# Patient Record
Sex: Male | Born: 1950 | Race: White | Hispanic: No | Marital: Married | Smoking: Never smoker
Health system: Southern US, Community
[De-identification: ages and names within clinical notes are randomized; demographics above are authoritative.]

## PROBLEM LIST (undated history)

## (undated) DIAGNOSIS — K219 Gastro-esophageal reflux disease without esophagitis: Secondary | ICD-10-CM

## (undated) DIAGNOSIS — N400 Enlarged prostate without lower urinary tract symptoms: Secondary | ICD-10-CM

## (undated) DIAGNOSIS — H40009 Preglaucoma, unspecified, unspecified eye: Secondary | ICD-10-CM

## (undated) DIAGNOSIS — N200 Calculus of kidney: Secondary | ICD-10-CM

## (undated) DIAGNOSIS — N281 Cyst of kidney, acquired: Secondary | ICD-10-CM

## (undated) DIAGNOSIS — Z860100 Personal history of colon polyps, unspecified: Secondary | ICD-10-CM

## (undated) DIAGNOSIS — Z9889 Other specified postprocedural states: Secondary | ICD-10-CM

## (undated) DIAGNOSIS — Z87442 Personal history of urinary calculi: Secondary | ICD-10-CM

## (undated) DIAGNOSIS — Z978 Presence of other specified devices: Secondary | ICD-10-CM

## (undated) DIAGNOSIS — Z87448 Personal history of other diseases of urinary system: Secondary | ICD-10-CM

## (undated) DIAGNOSIS — R339 Retention of urine, unspecified: Secondary | ICD-10-CM

## (undated) DIAGNOSIS — Z960 Presence of urogenital implants: Secondary | ICD-10-CM

## (undated) DIAGNOSIS — Z973 Presence of spectacles and contact lenses: Secondary | ICD-10-CM

## (undated) DIAGNOSIS — C801 Malignant (primary) neoplasm, unspecified: Secondary | ICD-10-CM

## (undated) DIAGNOSIS — Z85828 Personal history of other malignant neoplasm of skin: Secondary | ICD-10-CM

## (undated) DIAGNOSIS — G43909 Migraine, unspecified, not intractable, without status migrainosus: Secondary | ICD-10-CM

## (undated) HISTORY — PX: TONSILLECTOMY: SUR1361

---

## 1999-07-15 HISTORY — PX: COLONOSCOPY W/ POLYPECTOMY: SHX1380

## 2000-05-14 ENCOUNTER — Encounter (INDEPENDENT_AMBULATORY_CARE_PROVIDER_SITE_OTHER): Payer: Self-pay | Admitting: Specialist

## 2000-05-14 ENCOUNTER — Encounter: Payer: Self-pay | Admitting: Gastroenterology

## 2000-05-14 ENCOUNTER — Ambulatory Visit (HOSPITAL_COMMUNITY): Admission: RE | Admit: 2000-05-14 | Discharge: 2000-05-14 | Payer: Self-pay | Admitting: Gastroenterology

## 2000-07-14 HISTORY — PX: EXTRACORPOREAL SHOCK WAVE LITHOTRIPSY: SHX1557

## 2000-11-03 ENCOUNTER — Encounter: Admission: RE | Admit: 2000-11-03 | Discharge: 2001-02-01 | Payer: Self-pay | Admitting: Internal Medicine

## 2002-11-17 ENCOUNTER — Ambulatory Visit (HOSPITAL_COMMUNITY): Admission: RE | Admit: 2002-11-17 | Discharge: 2002-11-17 | Payer: Self-pay | Admitting: Gastroenterology

## 2002-11-17 ENCOUNTER — Encounter (INDEPENDENT_AMBULATORY_CARE_PROVIDER_SITE_OTHER): Payer: Self-pay | Admitting: Specialist

## 2004-01-10 ENCOUNTER — Observation Stay (HOSPITAL_COMMUNITY): Admission: EM | Admit: 2004-01-10 | Discharge: 2004-01-10 | Payer: Self-pay | Admitting: Emergency Medicine

## 2006-02-09 ENCOUNTER — Encounter: Admission: RE | Admit: 2006-02-09 | Discharge: 2006-02-09 | Payer: Self-pay | Admitting: Gastroenterology

## 2008-03-18 ENCOUNTER — Emergency Department (HOSPITAL_COMMUNITY): Admission: EM | Admit: 2008-03-18 | Discharge: 2008-03-19 | Payer: Self-pay | Admitting: Emergency Medicine

## 2009-08-14 HISTORY — PX: PARS PLANA VITRECTOMY W/ REPAIR OF MACULAR HOLE: SHX2170

## 2010-07-14 HISTORY — PX: CATARACT EXTRACTION W/ INTRAOCULAR LENS IMPLANT: SHX1309

## 2010-11-29 NOTE — Op Note (Signed)
NAME:  Andrew Coffey, Andrew Coffey                         ACCOUNT NO.:  [id number]   MEDICAL RECORD NO.:  [id number]                   PATIENT TYPE:  AMB   LOCATION:  ENDO                                 FACILITY:  [facility]   PHYSICIAN:  Petra Kuba, M.D.                 DATE OF BIRTH:  [birth year]   DATE OF PROCEDURE:  11/17/2002  DATE OF DISCHARGE:                                 OPERATIVE REPORT   PROCEDURE:  Colonoscopy.   INDICATIONS FOR PROCEDURE:  A patient with a history of colon polyps due for  repeat screening.   Consent was signed after risks, benefits, methods, and options were  thoroughly discussed in the office.   MEDICINES USED:  Demerol 80, Versed 8.5   DESCRIPTION OF PROCEDURE:  Rectal inspection was pertinent for external  hemorrhoids, small. Digital exam was negative. The video colonoscope was  inserted, easily advanced around the colon to the cecum. This did require  some abdominal pressure but no position changes. On insertion, some left  greater than right diverticula were seen. The cecum was identified by the  appendiceal orifice and the ileocecal valve. The scope was slowly withdrawn.  The prep was adequate. There was some liquid stool that required washing and  suctioning. On slow withdrawal through the colon, the cecum, ascending and  transverse were normal except for an occasional diverticula. The diverticula  increased as we went distally. There were a few tiny left sided polyps. The  descending, sigmoid and rectum all appeared hyperplastic and all were cold  biopsied x1 or 2 and put in the same container. Once back in the rectum, the  scope was retroflexed revealing some internal hemorrhoids.  The scope was  straightened and readvanced a short ways up the left side of the colon, air  was suctioned, scope removed. The patient tolerated the procedure well.  There was no obvious or immediate complications.   ENDOSCOPIC DIAGNOSIS:  1. Internal and external  hemorrhoids.  2. Left greater than right diverticula.  3. A few tiny left sided polyps cold biopsied appeared hyperplastic.  4. Otherwise within normal limits to the cecum.    PLAN:  Await pathology to determine future colon screening, would probably  recheck in five years. Return care to Dr. Kevan Ny for the customary health  care maintenance to include yearly rectals and guaiacs and continue workup  with repeat EGD which helped him in the past.                                               Petra Kuba, M.D.    MEM/MEDQ  D:  11/17/2002  T:  11/17/2002  Job:  [id number]   cc:   Candyce Churn, M.D.  [address]  [place] [address]  Fax: [phone]

## 2010-11-29 NOTE — Op Note (Signed)
NAME:  Andrew Coffey, Andrew Coffey                         ACCOUNT NO.:  [id number]   MEDICAL RECORD NO.:  [id number]                   PATIENT TYPE:  AMB   LOCATION:  ENDO                                 FACILITY:  [facility]   PHYSICIAN:  Petra Kuba, M.D.                 DATE OF BIRTH:  10-15-1950   DATE OF PROCEDURE:  11/17/2002  DATE OF DISCHARGE:                                 OPERATIVE REPORT   PROCEDURE:  Esophagogastroduodenoscopy with Savary dilatation.   INDICATIONS FOR PROCEDURE:  Recurring dysphagia.   Consent was signed prior to any premeds given after risks, benefits,  methods, and options were thoroughly discussed multiple times in the past.   ADDITIONAL MEDICINES FOR THIS PROCEDURE:  1.5 mg of Versed only.   DESCRIPTION OF PROCEDURE:  The video endoscope was inserted by direct  vision. The esophagus appeared normal. No obvious hiatal hernia, stricture  or ring were seen. He did seem to have a tapered tortuous distal esophagus.  The scope passed easily into the stomach and advanced through a normal  antrum, normal pylorus into a normal duodenal bulb and around the C loop to  a normal second portion of the duodenum. The scope was slowly withdrawn back  to the bulb. A good look there ruled out abnormalities in that location. The  scope was withdrawn back to the stomach and retroflexed, angularis, cardia  and fundus were all normal. The scope was straightened and straight  visualization of the stomach did not reveal any additional findings. The  scope was then slowly withdrawn back to about 18 cm. Again, a good look at  the esophagus confirmed the above findings. The scope was then slowly  advanced one more time to the antrum. No additional findings were seen. The  Savary wire was advanced and using the 1:1 advancement and withdrawal the  scope was removed. The wire was at the 4 mark which equaled 80 cm although  no fluoro was used. This is the customary place where we are nicely  curled  in the antrum. We went ahead and did Savary without the fluoro using both 14  and then 16 mm dilators. Both times we were concerned we were at the 4 bar  mark after exchange. Both passed with very minimal resistance in the  posterior pharynx and there was no heme on either dilator. We withdrew the  wire back into the 16 when it was almost at his mouth. Both the wire and the  dilator were removed in tandem. The patient tolerated the procedure well,  there was no obvious or immediate complication.   ENDOSCOPIC DIAGNOSIS:  1. Tapered esophagus without obvious ring or stricture.  2. Otherwise normal EGD.   THERAPY:  Savary dilatation without fluoro to 16 mm.    PLAN:  Followup p.r.n. or in two months. Consider a barium swallow if not  done in the past to rule  out achalasia. Certainly if it is questionable for  achalasia would proceed with manometry, but happy to follow him clinically  for now since his dilation in the past seems to have lasted 2-3 years. Since  no obvious reflux change and he has done well without medicines will hold  medicines at this juncture, will discuss that in followup.                                               Petra Kuba, M.D.    MEM/MEDQ  D:  11/17/2002  T:  11/18/2002  Job:  [id number]   cc:   Candyce Churn, M.D.  [address] [place]  [place] [address]  Fax: [phone]

## 2010-11-29 NOTE — Procedures (Signed)
[facility]  Patient:    MOUSA, PROUT                      MRN: [id number] Proc. Date: 05/14/00 Adm. Date:  [id number] Attending:  Nelda Marseille CC:         Pearla Dubonnet, M.D.   Procedure Report  PROCEDURE:  Colonoscopy.  INDICATION:  Patient due for colonic screening.  Consent was signed after risks, benefits, methods, and options thoroughly discussed in the past and by Dr. Haig Prophet P.A., Raynelle Fanning _____.  DESCRIPTION OF PROCEDURE:  Rectal inspection was pertinent for external hemorrhoids.  Digital exam was negative.  Video colonoscope was inserted and a small rectal polyp was seen.  At that juncture, we decided to proceed with a colonoscopy if easily done, which was easily advanced around the colon to the cecum.  Some left greater than right diverticula were seen on insertion, and there were no significant abnormalities.  To advance to the cecum did require some left lower quadrant pressure but no position changes.  The cecum was identified by the appendiceal orifice and the ileocecal valve.  In the cecum, a 1 cm polyp was seen, sessile, which was carefully snared x 1 with very minimal pieces.  Electrocautery was carefully applied at a setting of 20/20, and the pieces removed were suctioned through the scope and collected in the trap.  There seemed to be a touch of residual adenomatous tissue, which was hot biopsied x 1.  The scope was further withdrawn.  In the mid-ascenidng, a 2 mm polyp was seen and was hot biopsied x 1 and put in the same container as the cecal polyp.  In the transverse and descending were four semi-sessile polyps on folds, possibly hyperplastic-appearing, that were all hot biopsied and put in the second container.  Again some occasional right-sided diverticula were seen on slow withdrawal, but they were slightly greater on the left.  No other polypoid lesions were seen as we slowly withdrew back to the rectum.  The  prep was adequate.  There was some liquid stool that required washing and suctioning.  In the rectum, two small polyps were seen and were each hot biopsied x 1 or 2 and put in a third container.  The scope was retroflexed, revealing some internal hemorrhoids.  The scope was straightened and readvanced a short way around the sigmoid, air was suctioned, and scope removed.  The patient tolerated the procedure well, and there was no obvious immediate complication.  ENDOSCOPIC DIAGNOSES: 1. Internal and external hemorrhoids. 2. Left greater than right occasional diverticula. 3. Multiple tiny to small polyps, two in the rectum, four in the descending-    transverse, one in the ascending, and one in the cecum, status post all hot    biopsied except for two snares and a hot biopsy in the cecum. 4. Otherwise within normal limits to the cecum.  PLAN:  Await pathology to determine future colonic screening.  Customary two-weeks postpolypectomy instruction.  See back per endoscopy dictation or call sooner p.r.n.  Otherwise will return care to Dr. Kevan Ny for customary yearly rectals and guaiacs and other health care maintenance. DD:  05/14/00 TD:  05/15/00 Job: [id number]

## 2010-11-29 NOTE — Procedures (Signed)
[facility]  Patient:    Andrew Coffey, Andrew Coffey                      MRN: [id number] Proc. Date: 05/14/00 Adm. Date:  [id number] Attending:  Nelda Marseille CC:         Pearla Dubonnet, M.D.   Procedure Report  REFERRING PHYSICIAN:  Pearla Dubonnet, M.D.  PROCEDURE:  Esophagogastroduodenoscopy with Savary dilatation under fluoroscopy.  INDICATION:  Recurrent dysphagia.  Consent was signed after risks, benefits, methods, and options thoroughly discussed in the office by Raynelle Fanning _____, Dr. Haig Prophet P.A., as well as by me in the past.  MEDICATIONS:  He was given Demerol 60 mg, Versed 6 mg.  DESCRIPTION OF PROCEDURE:  The video endoscope was inserted by direct vision. The proximal and midsesophagus were normal.  In the distal esophagus there seemed to be some tapering like before and a small fibrous ring, maybe a tiny hiatal hernia was seen.  The scope was inserted easily past this area and advanced through a normal antrum, normal pylorus, which widely patent, to a normal duodenal bulb, around the C-loop to a normal second portion of the duodenum.  The scope was slowly withdrawn back to the stomach, which again confirmed the normal duodenum.  Once back in the stomach, the scope was retroflexed.  Angularis, cardia, fundus, lesser and greater curve were all normal on retroflexion and on straight visualization except for some possible minimal gastritis.  The scope was then slowly withdrawn back to 20 cm.  No additional esophageal findings were seen.  The scope was re-advanced to the antrum.  A Savary wire was advanced and confirmed in the proper position under fluoroscopy.  The scope was slowly withdrawn, making sure to keep the wire in the proper location, and once done in succession, the Savary 14, 16, and 17 mm dilators were all advanced into the stomach, confirmed in the proper position. There was no resistance in passing any of the dilators.  Once  the 17 was advanced into the stomach, the wire was withdrawn back in the dilator, the dilator was removed.  There was minimal heme on that dilator but none on the earlier two.  The patient tolerated the procedure well.  There was no obvious immediate complication.  ENDOSCOPIC DIAGNOSES: 1. Tiny hiatal hernia questioning. 2. Slight tapering of the distal esophagus with a minimal fibrous ring. 3. Minimal gastritis. 4. Widely patent pylorus. 5. Otherwise normal EGD.  THERAPY:  Savary dilatation under fluoroscopy to 17 mm.  PLAN:  If he has any upper tract symptoms or this seems to recur quicker than every three years, might consider pump inhibition or continuing workup with a barium swallow and possibly order manometry, but for now will just follow him clinically, proceed with a screening flexible sigmoidoscopy/colonoscopy, and see him back in three to four months to re-discuss his swallowing.  Have him call me sooner p.r.n. DD:  05/14/00 TD:  05/15/00 Job: [id number]

## 2011-04-16 LAB — POCT I-STAT, CHEM 8
Calcium, Ion: 1.15
Chloride: 102
Creatinine, Ser: 1.4
Glucose, Bld: 127 — ABNORMAL HIGH
HCT: 39

## 2011-04-16 LAB — DIFFERENTIAL
Basophils Relative: 0
Eosinophils Absolute: 0
Eosinophils Relative: 0
Monocytes Relative: 9
Neutrophils Relative %: 86 — ABNORMAL HIGH

## 2011-04-16 LAB — POCT CARDIAC MARKERS
CKMB, poc: 2.6
Troponin i, poc: <0.05

## 2011-04-16 LAB — CBC
HCT: 37.8 — ABNORMAL LOW
MCHC: 33.4
MCV: 90.3
Platelets: 221
RBC: 4.19 — ABNORMAL LOW

## 2011-10-16 DIAGNOSIS — IMO0002 Reserved for concepts with insufficient information to code with codable children: Secondary | ICD-10-CM | POA: Insufficient documentation

## 2011-10-16 DIAGNOSIS — H43819 Vitreous degeneration, unspecified eye: Secondary | ICD-10-CM | POA: Insufficient documentation

## 2011-10-16 DIAGNOSIS — H35349 Macular cyst, hole, or pseudohole, unspecified eye: Secondary | ICD-10-CM | POA: Insufficient documentation

## 2011-10-16 DIAGNOSIS — Z961 Presence of intraocular lens: Secondary | ICD-10-CM | POA: Insufficient documentation

## 2011-10-21 ENCOUNTER — Emergency Department (HOSPITAL_COMMUNITY): Admission: EM | Admit: 2011-10-21 | Discharge: 2011-10-21 | Discharge status: Against Medical Advice | Payer: Self-pay

## 2011-10-21 DIAGNOSIS — K222 Esophageal obstruction: Secondary | ICD-10-CM | POA: Insufficient documentation

## 2011-10-21 DIAGNOSIS — IMO0002 Reserved for concepts with insufficient information to code with codable children: Secondary | ICD-10-CM | POA: Insufficient documentation

## 2011-10-21 DIAGNOSIS — T18108A Unspecified foreign body in esophagus causing other injury, initial encounter: Secondary | ICD-10-CM | POA: Insufficient documentation

## 2011-10-21 NOTE — ED Notes (Signed)
Pt is stable no acute distress. Pt states he has a stricter.

## 2011-10-21 NOTE — ED Notes (Signed)
Pt states he left and his throat has started to become irritated from all the coughing

## 2011-10-22 ENCOUNTER — Emergency Department (HOSPITAL_COMMUNITY): Payer: BC Managed Care – PPO

## 2011-10-22 ENCOUNTER — Emergency Department (HOSPITAL_COMMUNITY)
Admission: EM | Admit: 2011-10-22 | Discharge: 2011-10-22 | Disposition: A | Discharge status: Home Health | Payer: BC Managed Care – PPO | Attending: Emergency Medicine | Admitting: Emergency Medicine

## 2011-10-22 ENCOUNTER — Encounter (HOSPITAL_COMMUNITY)
Admission: EM | Disposition: A | Discharge status: Home Health | Payer: Self-pay | Source: Home / Self Care | Attending: Emergency Medicine

## 2011-10-22 ENCOUNTER — Encounter (HOSPITAL_COMMUNITY): Payer: Self-pay | Admitting: Emergency Medicine

## 2011-10-22 DIAGNOSIS — T18108A Unspecified foreign body in esophagus causing other injury, initial encounter: Secondary | ICD-10-CM

## 2011-10-22 HISTORY — PX: ESOPHAGOGASTRODUODENOSCOPY: SHX5428

## 2011-10-22 SURGERY — EGD (ESOPHAGOGASTRODUODENOSCOPY)
Anesthesia: Moderate Sedation

## 2011-10-22 MED ORDER — FENTANYL CITRATE 0.05 MG/ML IJ SOLN
INTRAMUSCULAR | Status: DC | PRN
  Administered 2011-10-22 (×3): 25 ug via INTRAVENOUS

## 2011-10-22 MED ORDER — ONDANSETRON HCL 4 MG/2ML IJ SOLN
4.0000 mg | Freq: Once | INTRAMUSCULAR | Status: AC
  Administered 2011-10-22: 4 mg via INTRAVENOUS
  Filled 2011-10-22: qty 2

## 2011-10-22 MED ORDER — GLUCAGON HCL (RDNA) 1 MG IJ SOLR
1.0000 mg | Freq: Once | INTRAMUSCULAR | Status: AC
  Administered 2011-10-22: 1 mg via INTRAVENOUS
  Filled 2011-10-22: qty 1

## 2011-10-22 MED ORDER — MORPHINE SULFATE 4 MG/ML IJ SOLN
4.0000 mg | Freq: Once | INTRAMUSCULAR | Status: AC
  Administered 2011-10-22: 4 mg via INTRAVENOUS
  Filled 2011-10-22: qty 1

## 2011-10-22 MED ORDER — OMEPRAZOLE 20 MG PO CPDR: 20.0000 mg | DELAYED_RELEASE_CAPSULE | Freq: Every day | ORAL | Status: DC

## 2011-10-22 MED ORDER — MIDAZOLAM HCL 10 MG/2ML IJ SOLN
INTRAMUSCULAR | Status: DC | PRN
  Administered 2011-10-22 (×3): 2 mg via INTRAVENOUS
  Administered 2011-10-22: 1 mg via INTRAVENOUS

## 2011-10-22 MED ORDER — BUTAMBEN-TETRACAINE-BENZOCAINE 2-2-14 % EX AERO
INHALATION_SPRAY | CUTANEOUS | Status: DC | PRN
  Administered 2011-10-22: 1 via TOPICAL

## 2011-10-22 NOTE — Discharge Instructions (Addendum)
Swallowed Foreign Body, Adult You have swallowed an object (foreign body). Once the foreign body has passed through the food tube (esophagus), which leads from the mouth to the stomach, it will usually continue through the body without problems. This is because the point where the esophagus enters into the stomach is the narrowest place through which the foreign body must pass. Sometimes the foreign body gets stuck. The most common type of foreign body obstruction in adults is food impaction. Many times, bones from fish or meat products may become lodged in the esophagus or injure the throat on the way down. When there is an object that obstructs the esophagus, the most obvious symptoms are pain and the inability to swallow normally. In some cases, foreign bodies that can be life threatening are swallowed. Examples of these are certain medications and illicit drugs. Often in these instances, patients are afraid of telling what they swallowed. However, it is extremely important to tell the emergency caregiver what was swallowed because life-saving treatment may be needed.  X-ray exams may be taken to find the location of the foreign body. However, some objects do not show up well or may be too small to be seen on an X-ray image. If the foreign body is too large or too sharp, it may be too dangerous to allow it to pass on its own. You may need to see a caregiver who specializes in the digestive system (gastroenterologist). In a few cases, a specialist may need to remove the object using a method called "endoscopy". This involves passing a thin, soft, flexible tube into the food pipe to locate and remove the object. Follow up with your primary doctor or the referral you were given by the emergency caregiver. HOME CARE INSTRUCTIONS   If your caregiver says it is safe for you to eat, then only have liquids and soft foods until your symptoms improve.   Pureed or finely chopped foods only.  Cut food into small  pieces.   Remove small bones from food.   Remove large seeds and pits from fruit.   Chew your food well.   Do not talk, laugh, or engage in physical activity while eating or swallowing.  SEEK MEDICAL CARE IF:  You develop worsening shortness of breath, uncontrollable coughing, chest pains or high fever, greater than 102 F (38.9 C).   You are unable to eat or drink or you feel that food is getting stuck in your throat.   You have choking symptoms or cannot stop drooling.   You develop abdominal pain, vomiting (especially of blood), or rectal bleeding.  MAKE SURE YOU:   Understand these instructions.   Will watch your condition.   Will get help right away if you are not doing well or get worse.   Our office [facility], [phone]) will call you in the next couple days to arrange repeat endoscopy for esophageal dilatation. Document Released: 12/18/2009 Document Revised: 06/19/2011 Document Reviewed: 12/18/2009 [facility] Patient Information 2012 [place], [place].

## 2011-10-22 NOTE — Consult Note (Signed)
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 Ewing Schlein.  Has had progressive dysphagia of late and presents with suspected food impaction since dinner last night.  Has persistent sialorrhea and sensation of impacted food bolus in distal esophagus.  Has had a prior food impaction years ago with similar symptoms.  Last esophageal dilatation was several years ago per patient.  Some throat and chest soreness; neck xray in ED negative.  PMHx:  Precancerous skin lesion removal             Tonsillectomy             Migraine H/A  MEDS:  Midrin prn  ALL:  NKDA  FHx:  Noncontributory  SocHx:  Attorney  ROS:  As per HPI; all others negative.  PE: GEN:  NAD HEENT:  Sialorrhea with inability to handle secretions CV:  II/VI SEM RUSB; regular Lungs:  CTA ABD:  Soft EXT:  No CCE NEURO:  Diffusely weak, otherwise nonfocal without lateralizing signs.  XRAY:  Neck Xray negative.  Labs:  None  Assessment:  Sialorrhea with suspected esophageal food impaction.  Plan:   1.  Endoscopy with possible esophageal foreign body removal. 2.  Risks (bleeding, infection, bowel perforation that could require surgery, sedation-related changes in cardiopulmonary systems), benefits (identification and possible treatment of source of symptoms, exclusion of certain causes of symptoms), and alternatives (watchful waiting, radiographic imaging studies, empiric medical treatment) of upper endoscopy (EGD) were explained to patient/family in detail and patient wishes to proceed.

## 2011-10-22 NOTE — ED Notes (Signed)
Pt states he has a piece of [place] stuck in his throat  Pt states it is more difficult to breathe and cannot swallow his saliva  Pt states sxs started around 1930 tonight

## 2011-10-22 NOTE — H&P (Signed)
Patient interval history reviewed.  Patient examined again.  There has been no change from documented ED H/P and my consult note dated 10/22/11.  Endoscopy planned for suspected esophageal food impaction.  Risks (bleeding, infection, bowel perforation that could require surgery, sedation-related changes in cardiopulmonary systems), benefits (identification and possible treatment of source of symptoms, exclusion of certain causes of symptoms), and alternatives (watchful waiting, radiographic imaging studies, empiric medical treatment) of upper endoscopy (EGD) were explained to patient/family in detail and patient wishes to proceed.

## 2011-10-22 NOTE — ED Notes (Signed)
Pt given water, unable to swallow - vomited after drinking

## 2011-10-22 NOTE — Op Note (Signed)
[facility] [address] [place], [place]  [address]  ENDOSCOPY PROCEDURE REPORT  PATIENT:  Andrew Coffey, Andrew Coffey  MR#:  [id number] BIRTHDATE:  1950-08-19, 60 yrs. old  GENDER:  male ENDOSCOPIST:  Willis Modena, MD Referred by:  R. Robley Fries, M.D. Vida Rigger, M.D. PROCEDURE DATE:  10/22/2011 PROCEDURE:  EGD with foreign body removal ASA CLASS:  Class II INDICATIONS:  food impaction MEDICATIONS:  Cetacaine spray x 1, Fentanyl 75 mcg IV, Versed 7 mg IV DESCRIPTION OF PROCEDURE:   After the risks benefits and alternatives of the procedure were thoroughly explained, informed consent was obtained.  The Pentax Gastroscope [id number] endoscope was introduced through the mouth and advanced to the second portion of the duodenum, without limitations.  The instrument was slowly withdrawn as the mucosa was fully examined. <<PROCEDUREIMAGES>> FINDINGS:  Large impacted food bolus in distal esophagus. Attempts to break up the bolus with forceps unsuccessful.  Could not get Lucina Mellow net to fully envelop around the bolus.  A pronged basket was able to grasp the bolus, and I was able to pull much of the impacted bolus out through the mouth.  There were some smaller esophageal food remnants seen, which were easily nudged into the stomach.  Upon esophageal clearance, there was tight benign-appearing distal esophageal stricture, approximately 14mm in diameter.  There was some surrounding ulceration and edema in the distal esophagus, likely from prolonged food impaction, but no obvious mucosal tearing and no features of eosinophilic esophagitis were identified.  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  ENDOSCOPIC IMPRESSION:    1.  Large distal esophageal food impaction, removed as described above. 2.  Underlying moderately tight distal esophageal stricture with surrounding edema and  ulceration. 3.  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 4.  Otherwise normal endoscopy.  RECOMMENDATIONS:      1.  Watch for potential complications of procedure. 2.  Avoid fibrous meats, breads, and fresh vegetables. 3.  Consume only pureed or finely chopped/diced foods until further notice. 4.  Prilosec 20 mg once-a-day until further notice. 5.  Will need repeat endoscopy with esophageal dilatation in the next couple weeks by Dr. Ewing Schlein.  REPEAT EXAM:    2 weeks with esophageal dilatation.  ______________________________ Willis Modena  n. eSIGNEDWillis Modena at 10/22/2011 05:23 AM  Leron Croak, [phone]

## 2011-10-22 NOTE — ED Provider Notes (Signed)
History     CSN: [id number]  Arrival date & time 10/21/11  2305   First MD Initiated Contact with Patient 10/22/11 0110      Chief Complaint  Patient presents with  . Foreign Body    (Consider location/radiation/quality/duration/timing/severity/associated sxs/prior treatment) HPI Comments: 61-year-old male with history of esophageal strictures and requires esophageal dilation approximately every 5 years by Dr. Ewing Schlein.  he presents approximately 6 hours after swallowing a large piece of [place] which she feels has become stuck in his throat, place to the manubrium as the location. The symptoms are persistent, gradually getting worse, associated with some mild pain in his neck, headache and some nausea. He has been unable to tolerate his secretions. He states that this has happened in the past but has not required endoscopy to help push the food down in the last few years.  Patient is a 61 y.o. male presenting with foreign body. The history is provided by the patient.  Foreign Body     Past Medical History  Diagnosis Date  . Esophageal stricture   . Headache     Past Surgical History  Procedure Date  . Colonscopy   . Appendectomy   . Esophageal dilitation     History reviewed. No pertinent family history.  History  Substance Use Topics  . Smoking status: Never Smoker   . Smokeless tobacco: Not on file  . Alcohol Use: Yes      Review of Systems  All other systems reviewed and are negative.    Allergies  Review of patient's allergies indicates no known allergies.  Home Medications   Current Outpatient Rx  Name Route Sig Dispense Refill  . OMEPRAZOLE 20 MG PO CPDR Oral Take 1 capsule (20 mg total) by mouth daily.      BP 128/79  Pulse 80  Temp(Src) 97.8 F (36.6 C) (Oral)  Resp 13  Ht 5\' 11"  (1.803 m)  Wt 185 lb (83.915 kg)  BMI 25.80 kg/m2  SpO2 95%  Physical Exam  Nursing note and vitals reviewed. Constitutional: He appears well-developed and  well-nourished. No distress.  HENT:  Head: Normocephalic and atraumatic.  Mouth/Throat: Oropharynx is clear and moist. No oropharyngeal exudate.       Oropharynx is clear, no blood, no foreign bodies, mucous membranes moist  Eyes: Conjunctivae and EOM are normal. Pupils are equal, round, and reactive to light. Right eye exhibits no discharge. Left eye exhibits no discharge. No scleral icterus.  Neck: Normal range of motion. Neck supple. No JVD present. No thyromegaly present.  Cardiovascular: Normal rate, regular rhythm, normal heart sounds and intact distal pulses.  Exam reveals no gallop and no friction rub.   No murmur heard. Pulmonary/Chest: Effort normal and breath sounds normal. No respiratory distress. He has no wheezes. He has no rales.  Abdominal: Soft. Bowel sounds are normal. He exhibits no distension and no mass. There is no tenderness.  Musculoskeletal: Normal range of motion. He exhibits no edema and no tenderness.  Lymphadenopathy:    He has no cervical adenopathy.  Neurological: He is alert. Coordination normal.  Skin: Skin is warm and dry. No rash noted. No erythema.  Psychiatric: He has a normal mood and affect. His behavior is normal.    ED Course  Procedures (including critical care time)  Labs Reviewed - No data to display Dg Neck Soft Tissue  10/22/2011  *RADIOLOGY REPORT*  Clinical Data: Piece of [place] stuck in throat.  NECK SOFT TISSUES - 1+ VIEW  Comparison: None.  Findings: No radiopaque foreign bodies are identified.  The nasopharynx, oropharynx and hypopharynx are grossly unremarkable in appearance.  The epiglottis is within normal limits.  The aryepiglottic folds are unremarkable.  The proximal trachea is unremarkable in appearance.  Prevertebral soft tissues are normal in thickness.  Mild degenerative change is noted along the lower cervical spine, with scattered small anterior and posterior disc osteophyte complexes. The visualized paranasal sinuses and mastoid  air cells are well- aerated.  The visualized lung apices are clear.  IMPRESSION: No radiopaque foreign bodies seen; no significant abnormality seen with respect to the soft tissues of the neck.  Original Report Authenticated By: Tonia Ghent, M.D.     1. Esophageal foreign body       MDM  Patient appears mildly uncomfortable and is unable to tolerate his own salivary secretions. He is not vomiting and has vital signs which are within normal limits on my exam. We'll perform lateral soft tissue of the neck x-ray, glucagon, contact gastroenterology for likely endoscopy and dilation.   X-ray of the neck negative for radiopaque foreign body, patient has not responded well to glucagon, I have consult with gastroenterologist Dr. Dulce Sellar who is aware of the patient and will prepare the endoscopy team to take care of this esophageal obstruction within the next work time.    Vida Roller, MD 10/22/11 [phone]

## 2011-10-24 ENCOUNTER — Encounter (HOSPITAL_COMMUNITY): Payer: Self-pay | Admitting: Gastroenterology

## 2011-11-03 ENCOUNTER — Other Ambulatory Visit: Payer: Self-pay | Admitting: Dermatology

## 2011-11-20 ENCOUNTER — Encounter (HOSPITAL_COMMUNITY): Payer: Self-pay | Admitting: Pharmacy Technician

## 2011-11-21 ENCOUNTER — Ambulatory Visit (HOSPITAL_COMMUNITY)
Admission: RE | Admit: 2011-11-21 | Discharge: 2011-11-21 | Disposition: A | Discharge status: Home / Self Care | Payer: BC Managed Care – PPO | Source: Ambulatory Visit | Attending: Gastroenterology | Admitting: Gastroenterology

## 2011-11-21 ENCOUNTER — Encounter (HOSPITAL_COMMUNITY)
Admission: RE | Disposition: A | Discharge status: Home / Self Care | Payer: Self-pay | Source: Ambulatory Visit | Attending: Gastroenterology

## 2011-11-21 ENCOUNTER — Encounter (HOSPITAL_COMMUNITY): Payer: Self-pay | Admitting: *Deleted

## 2011-11-21 ENCOUNTER — Ambulatory Visit (HOSPITAL_COMMUNITY): Payer: BC Managed Care – PPO

## 2011-11-21 DIAGNOSIS — K449 Diaphragmatic hernia without obstruction or gangrene: Secondary | ICD-10-CM | POA: Insufficient documentation

## 2011-11-21 HISTORY — PX: ESOPHAGOGASTRODUODENOSCOPY: SHX5428

## 2011-11-21 HISTORY — PX: SAVORY DILATION: SHX5439

## 2011-11-21 SURGERY — EGD (ESOPHAGOGASTRODUODENOSCOPY)
Anesthesia: Moderate Sedation

## 2011-11-21 MED ORDER — FENTANYL CITRATE 0.05 MG/ML IJ SOLN
INTRAMUSCULAR | Status: AC
  Filled 2011-11-21: qty 2

## 2011-11-21 MED ORDER — SODIUM CHLORIDE 0.9 % IV SOLN
Freq: Once | INTRAVENOUS | Status: AC
  Administered 2011-11-21: 13:00:00 via INTRAVENOUS

## 2011-11-21 MED ORDER — FENTANYL NICU IV SYRINGE 50 MCG/ML
INJECTION | INTRAMUSCULAR | Status: DC | PRN
  Administered 2011-11-21 (×3): 25 ug via INTRAVENOUS

## 2011-11-21 MED ORDER — DIPHENHYDRAMINE HCL 50 MG/ML IJ SOLN
INTRAMUSCULAR | Status: AC
  Filled 2011-11-21: qty 1

## 2011-11-21 MED ORDER — BUTAMBEN-TETRACAINE-BENZOCAINE 2-2-14 % EX AERO
INHALATION_SPRAY | CUTANEOUS | Status: DC | PRN
  Administered 2011-11-21: 2 via TOPICAL

## 2011-11-21 MED ORDER — MIDAZOLAM HCL 10 MG/2ML IJ SOLN
INTRAMUSCULAR | Status: DC | PRN
  Administered 2011-11-21 (×3): 2 mg via INTRAVENOUS

## 2011-11-21 MED ORDER — MIDAZOLAM HCL 10 MG/2ML IJ SOLN
INTRAMUSCULAR | Status: AC
  Filled 2011-11-21: qty 2

## 2011-11-21 NOTE — Discharge Instructions (Signed)
Call if question or problem or swallowing problems return and consider calling for a stronger prescription stomach medicine otherwise followup in one to 2 months and began with soft solids and slowly advance diet as tolerated

## 2011-11-21 NOTE — Op Note (Signed)
[facility] [address] [place], [place]  [address]  ENDOSCOPY PROCEDURE REPORT  PATIENT:  Coffey, Andrew  MR#:  [id number] BIRTHDATE:  [birth year], 60 yrs. old  GENDER:  male  ENDOSCOPIST:  Vida Rigger, MD Referred by:  R. Robley Fries, M.D.  PROCEDURE DATE:  11/21/2011 PROCEDURE:  EGD with dilatation over guidewire ASA CLASS:  Class I INDICATIONS:  Dysphagia history of food impaction  MEDICATIONS:  75 mcg of fentanyl and 6 mg of Versed TOPICAL ANESTHETIC: Used  DESCRIPTION OF PROCEDURE:   After the risks benefits and alternatives of the procedure were thoroughly explained, informed consent was obtained.  The Pentax Gastroscope [id number] endoscope was introduced through the mouth and advanced to the second portion of the duodenum, without limitations.  The instrument was slowly withdrawn as the mucosa was fully examined. Other than a tiny or small hiatal hernia and a tortuous esophagus no abnormalities were seen. Once the endoscopy was complete the scope was readvanced to the antrum a Savary wire was advanced and the customary J. loop of the wire was confirmed both endoscopically and under fluoroscopy and using fluoroscopy guidance the scope was removed making sure to keep the wire in the proper position in the antrum and once the scope was removed a Savary 16 mm dilator was advanced and confirmed in the proper position in the stomach under fluoroscopy there was no resistance in advancing this dilator the wire was withdrawn back into the dilator and both were removed in tandem there was no blood on the dilator and the procedure was terminated at this junction and the patient tolerated the procedure well and there was no obvious immediate complication <<PROCEDUREIMAGES>>  FINDINGS: 1 Tiny or small hiatal hernia and tortuous esophagus 2. Otherwise within normal limits EGD status post Savary dilatation under fluoroscopy to 16 mm only without resistance  or heme  COMPLICATIONS: None  ENDOSCOPIC IMPRESSION:  Above  RECOMMENDATIONS: Slowly advance diet continue pump inhibitor call me when necessary otherwise followup in one to 2 months to recheck symptoms and make sure no further workup or plans are needed  REPEAT EXAM:  As needed  ______________________________ Vida Rigger, MD  CC:  R. Robley Fries, MD  n. Rosalie DoctorVernia Buff Finis Hendricksen at 11/21/2011 01:21 PM  Leron Croak, [phone]

## 2011-11-24 ENCOUNTER — Encounter (HOSPITAL_COMMUNITY): Payer: Self-pay | Admitting: Gastroenterology

## 2012-07-14 HISTORY — PX: MOHS SURGERY: SUR867

## 2014-02-16 ENCOUNTER — Other Ambulatory Visit: Payer: Self-pay | Admitting: Dermatology

## 2014-10-04 ENCOUNTER — Inpatient Hospital Stay (HOSPITAL_COMMUNITY)
Admission: EM | Admit: 2014-10-04 | Discharge: 2014-10-08 | DRG: 684 | Disposition: A | Discharge status: Home / Self Care | Payer: BLUE CROSS/BLUE SHIELD | Attending: Family Medicine | Admitting: Family Medicine

## 2014-10-04 ENCOUNTER — Encounter (HOSPITAL_COMMUNITY): Payer: Self-pay | Admitting: Emergency Medicine

## 2014-10-04 DIAGNOSIS — N323 Diverticulum of bladder: Secondary | ICD-10-CM | POA: Diagnosis present

## 2014-10-04 DIAGNOSIS — K222 Esophageal obstruction: Secondary | ICD-10-CM | POA: Diagnosis present

## 2014-10-04 DIAGNOSIS — E875 Hyperkalemia: Secondary | ICD-10-CM | POA: Diagnosis present

## 2014-10-04 DIAGNOSIS — R51 Headache: Secondary | ICD-10-CM

## 2014-10-04 DIAGNOSIS — N179 Acute kidney failure, unspecified: Secondary | ICD-10-CM | POA: Diagnosis present

## 2014-10-04 DIAGNOSIS — N132 Hydronephrosis with renal and ureteral calculous obstruction: Secondary | ICD-10-CM | POA: Diagnosis present

## 2014-10-04 DIAGNOSIS — G8929 Other chronic pain: Secondary | ICD-10-CM | POA: Diagnosis present

## 2014-10-04 DIAGNOSIS — K219 Gastro-esophageal reflux disease without esophagitis: Secondary | ICD-10-CM | POA: Diagnosis present

## 2014-10-04 DIAGNOSIS — G4700 Insomnia, unspecified: Secondary | ICD-10-CM | POA: Diagnosis present

## 2014-10-04 DIAGNOSIS — N401 Enlarged prostate with lower urinary tract symptoms: Secondary | ICD-10-CM | POA: Diagnosis present

## 2014-10-04 DIAGNOSIS — R519 Headache, unspecified: Secondary | ICD-10-CM | POA: Diagnosis present

## 2014-10-04 DIAGNOSIS — IMO0001 Reserved for inherently not codable concepts without codable children: Secondary | ICD-10-CM | POA: Diagnosis present

## 2014-10-04 DIAGNOSIS — R030 Elevated blood-pressure reading, without diagnosis of hypertension: Secondary | ICD-10-CM

## 2014-10-04 DIAGNOSIS — Z85828 Personal history of other malignant neoplasm of skin: Secondary | ICD-10-CM

## 2014-10-04 DIAGNOSIS — C4491 Basal cell carcinoma of skin, unspecified: Secondary | ICD-10-CM | POA: Diagnosis present

## 2014-10-04 DIAGNOSIS — K210 Gastro-esophageal reflux disease with esophagitis: Secondary | ICD-10-CM | POA: Diagnosis not present

## 2014-10-04 DIAGNOSIS — D649 Anemia, unspecified: Secondary | ICD-10-CM | POA: Diagnosis present

## 2014-10-04 HISTORY — DX: Migraine, unspecified, not intractable, without status migrainosus: G43.909

## 2014-10-04 HISTORY — DX: Gastro-esophageal reflux disease without esophagitis: K21.9

## 2014-10-04 LAB — CBC WITH DIFFERENTIAL/PLATELET
BASOS PCT: 0 % (ref 0–1)
Basophils Absolute: 0 10*3/uL (ref 0.0–0.1)
Eosinophils Absolute: 0.2 10*3/uL (ref 0.0–0.7)
Eosinophils Relative: 3 % (ref 0–5)
HCT: 28.5 % — ABNORMAL LOW (ref 39.0–52.0)
HEMOGLOBIN: 9.5 g/dL — AB (ref 13.0–17.0)
LYMPHS ABS: 1 10*3/uL (ref 0.7–4.0)
Lymphocytes Relative: 18 % (ref 12–46)
MCH: 29.1 pg (ref 26.0–34.0)
MCHC: 33.3 g/dL (ref 30.0–36.0)
MCV: 87.4 fL (ref 78.0–100.0)
Monocytes Absolute: 0.7 10*3/uL (ref 0.1–1.0)
Monocytes Relative: 13 % — ABNORMAL HIGH (ref 3–12)
Neutro Abs: 3.6 10*3/uL (ref 1.7–7.7)
Neutrophils Relative %: 66 % (ref 43–77)
Platelets: 158 10*3/uL (ref 150–400)
RBC: 3.26 MIL/uL — AB (ref 4.22–5.81)
RDW: 13.8 % (ref 11.5–15.5)
WBC: 5.5 10*3/uL (ref 4.0–10.5)

## 2014-10-04 LAB — COMPREHENSIVE METABOLIC PANEL
ALK PHOS: 36 U/L — AB (ref 39–117)
ALT: 22 U/L (ref 0–53)
ANION GAP: 14 (ref 5–15)
AST: 27 U/L (ref 0–37)
Albumin: 3.9 g/dL (ref 3.5–5.2)
BUN: 106 mg/dL — AB (ref 6–23)
CHLORIDE: 103 mmol/L (ref 96–112)
CO2: 22 mmol/L (ref 19–32)
CREATININE: 7.13 mg/dL — AB (ref 0.50–1.35)
Calcium: 9.8 mg/dL (ref 8.4–10.5)
GFR, EST AFRICAN AMERICAN: 8 mL/min — AB (ref >90)
GFR, EST NON AFRICAN AMERICAN: 7 mL/min — AB (ref >90)
Glucose, Bld: 111 mg/dL — ABNORMAL HIGH (ref 70–99)
Potassium: 4.8 mmol/L (ref 3.5–5.1)
Sodium: 139 mmol/L (ref 135–145)
Total Bilirubin: 0.5 mg/dL (ref 0.3–1.2)
Total Protein: 7.7 g/dL (ref 6.0–8.3)

## 2014-10-04 LAB — URINE MICROSCOPIC-ADD ON

## 2014-10-04 LAB — URINALYSIS, ROUTINE W REFLEX MICROSCOPIC
Bilirubin Urine: NEGATIVE
Glucose, UA: NEGATIVE mg/dL
Ketones, ur: NEGATIVE mg/dL
NITRITE: NEGATIVE
Protein, ur: NEGATIVE mg/dL
Specific Gravity, Urine: 1.008 (ref 1.005–1.030)
UROBILINOGEN UA: 0.2 mg/dL (ref 0.0–1.0)
pH: 5.5 (ref 5.0–8.0)

## 2014-10-04 LAB — PROTEIN / CREATININE RATIO, URINE
Creatinine, Urine: 54.76 mg/dL
Total Protein, Urine: <6 mg/dL

## 2014-10-04 LAB — LACTATE DEHYDROGENASE: LDH: 162 U/L (ref 94–250)

## 2014-10-04 LAB — CREATININE, URINE, RANDOM: Creatinine, Urine: 54.37 mg/dL

## 2014-10-04 LAB — PROTIME-INR
INR: 1.17 (ref 0.00–1.49)
Prothrombin Time: 15 seconds (ref 11.6–15.2)

## 2014-10-04 LAB — SODIUM, URINE, RANDOM: Sodium, Ur: 43 mmol/L

## 2014-10-04 MED ORDER — POLYVINYL ALCOHOL 1.4 % OP SOLN: 1.0000 [drp] | Freq: Two times a day (BID) | OPHTHALMIC | Status: DC | PRN

## 2014-10-04 MED ORDER — HYDRALAZINE HCL 20 MG/ML IJ SOLN: 5.0000 mg | INTRAMUSCULAR | Status: DC | PRN

## 2014-10-04 MED ORDER — POLYETHYL GLYCOL-PROPYL GLYCOL 0.4-0.3 % OP SOLN: 1.0000 [drp] | Freq: Two times a day (BID) | OPHTHALMIC | Status: DC | PRN

## 2014-10-04 MED ORDER — ACETAMINOPHEN 650 MG RE SUPP: 650.0000 mg | Freq: Four times a day (QID) | RECTAL | Status: DC | PRN

## 2014-10-04 MED ORDER — ACETAMINOPHEN 325 MG PO TABS
650.0000 mg | ORAL_TABLET | Freq: Four times a day (QID) | ORAL | Status: DC | PRN
  Administered 2014-10-05 – 2014-10-06 (×2): 650 mg via ORAL
  Filled 2014-10-04 (×2): qty 2

## 2014-10-04 MED ORDER — HEPARIN SODIUM (PORCINE) 5000 UNIT/ML IJ SOLN
5000.0000 [IU] | Freq: Three times a day (TID) | INTRAMUSCULAR | Status: DC
  Administered 2014-10-04 – 2014-10-05 (×3): 5000 [IU] via SUBCUTANEOUS
  Filled 2014-10-04 (×5): qty 1

## 2014-10-04 MED ORDER — SODIUM CHLORIDE 0.9 % IV SOLN
INTRAVENOUS | Status: DC
  Administered 2014-10-04 – 2014-10-05 (×2): via INTRAVENOUS

## 2014-10-04 NOTE — Consult Note (Signed)
Reason for Consult: Acute renal failure Referring Physician: Dwaine Deter M.D. (patient's primary care provider) and Charlesetta Shanks M.D. (ED physician)   HPI:  63 year old Caucasian man with past medical history significant for esophageal stricture status post EGD/esophageal dilation, history of migraine type headaches and an apparently normal renal function at baseline. He was seen earlier today by Dr. Inda Merlin when he presented with an irritable stomach, worsening fatigue and insomnia together with dysgeusia "a copper taste in his mouth". Dr. Inda Merlin did labs that showed an elevated BUN of 108, creatinine 7.3 and potassium 5.3 as well as mild anemia-hemoglobin 9.9 g/dL.  About one month ago, he felt like he was having a cold and took some Advil Cold and Sinus that led to problems with poor urine output/urinary hesitancy (with persistent cough/congestion/clear sputum production) for which he saw Dr. Inda Merlin and was started on azithromycin-the latter of which relieved his cough/congestion/sputum production but he continued to have nocturia about 5-8 times every night. His nocturia caused insomnia. He states that his oral intake has been fair-he has made adjustments to compensate for his dysgeusia. He denies any pedal edema, orthopnea, orthostatic symptoms or gross hematuria. He has had previous episodes of dried crusted blood when he blows his nose but no history of epistaxis or hemoptysis.  He has never suffered acute renal failure in the past, he has never had kidney stones documented. He has been evaluated in the past by Dr. Risa Grill for microscopic hematuria that he has had recognized 4 years ago and more recently 1-1/2 years ago. Urology workup was negative for a source of his hematuria. He denies any recurrent vomiting or diarrhea and recently developed some nausea with gastritis type symptoms and poor appetite. He has not had any intravenous contrast exposure and has not been taking any nonsteroidals  (after stopping Advil Cold and Sinus about a month ago). He denies any skin rash and denies history of blood transfusions.  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  Past Medical History  Diagnosis Date  . Esophageal stricture   . Headache(784.0)   . Dysphagia   . Macular hole of right eye     repaired 2011    Past Surgical History  Procedure Laterality Date  . Colonscopy    . Appendectomy    . Esophageal dilitation    . Esophagogastroduodenoscopy  10/22/2011    Procedure: ESOPHAGOGASTRODUODENOSCOPY (EGD);  Surgeon: Arta Silence, MD;  Location: [facility];  Service: Endoscopy;  Laterality: N/A;  . Pars plana vitrectomy w/ repair of macular hole    . Eye surgery    . Cataract extraction      RT eye  . Esophagogastroduodenoscopy  11/21/2011    Procedure: ESOPHAGOGASTRODUODENOSCOPY (EGD);  Surgeon: Jeryl Columbia, MD;  Location[facility] ENDOSCOPY;  Service: Endoscopy;  Laterality: N/A;  have balloons available. will need c-arm  . Savory dilation  11/21/2011    Procedure: SAVORY DILATION;  Surgeon: Jeryl Columbia, MD;  Location: [facility] ENDOSCOPY;  Service: Endoscopy;  Laterality: N/A;    History reviewed. No pertinent family history.  Social History:  reports that he has never smoked. He does not have any smokeless tobacco history on file. He reports that he drinks alcohol. He reports that he does not use illicit drugs.  Allergies: No Known Allergies  Medications: Prior to Admission:  (Not in a hospital admission)  Results for orders placed or performed during the hospital encounter  of 10/04/14 (from the past 48 hour(s))  CBC with Differential     Status: Abnormal   Collection Time: 10/04/14  7:52 PM  Result Value Ref Range   WBC 5.5 4.0 - 10.5 K/uL   RBC 3.26 (L) 4.22 - 5.81 MIL/uL   Hemoglobin 9.5 (L) 13.0 - 17.0 g/dL   HCT 28.5 (L) 39.0 - 52.0 %   MCV 87.4 78.0 - 100.0 fL   MCH 29.1 26.0 - 34.0 pg   MCHC 33.3  30.0 - 36.0 g/dL   RDW 13.8 11.5 - 15.5 %   Platelets 158 150 - 400 K/uL   Neutrophils Relative % 66 43 - 77 %   Neutro Abs 3.6 1.7 - 7.7 K/uL   Lymphocytes Relative 18 12 - 46 %   Lymphs Abs 1.0 0.7 - 4.0 K/uL   Monocytes Relative 13 (H) 3 - 12 %   Monocytes Absolute 0.7 0.1 - 1.0 K/uL   Eosinophils Relative 3 0 - 5 %   Eosinophils Absolute 0.2 0.0 - 0.7 K/uL   Basophils Relative 0 0 - 1 %   Basophils Absolute 0.0 0.0 - 0.1 K/uL    No results found.  Review of Systems  Constitutional: Positive for chills and malaise/fatigue. Negative for fever, weight loss and diaphoresis.  HENT: Negative.   Eyes: Negative.   Respiratory: Positive for cough.   Cardiovascular: Negative.   Gastrointestinal: Positive for nausea and abdominal pain. Negative for vomiting, diarrhea and constipation.       Dysgeusia  Genitourinary: Positive for frequency. Negative for dysuria and flank pain.       Nocturia  Musculoskeletal: Negative.   Skin: Negative.   Neurological: Positive for weakness. Negative for dizziness, tingling and tremors.  Endo/Heme/Allergies: Negative.   All other systems reviewed and are negative.  Blood pressure 161/89, pulse 60, temperature 98.1 F (36.7 C), temperature source Oral, height 5' 10.5" (1.791 m), weight 81.647 kg (180 lb), SpO2 100 %. Physical Exam  Nursing note and vitals reviewed. Constitutional: He is oriented to person, place, and time. He appears well-developed and well-nourished. No distress.  HENT:  Head: Normocephalic and atraumatic.  Mouth/Throat: Oropharynx is clear and moist.  Eyes: Conjunctivae and EOM are normal. Pupils are equal, round, and reactive to light. No scleral icterus.  Neck: Normal range of motion. Neck supple. No JVD present. No thyromegaly present.  Cardiovascular: Normal rate, regular rhythm and normal heart sounds.  Exam reveals no friction rub.   No murmur heard. Respiratory: Effort normal and breath sounds normal. No respiratory  distress. He has no wheezes.  GI: Soft. Bowel sounds are normal. He exhibits no distension. There is no tenderness. There is no guarding.  Bladder margin felt suprapubic  Musculoskeletal: Normal range of motion. He exhibits no edema.  Neurological: He is alert and oriented to person, place, and time. No cranial nerve deficit. Coordination normal.  Skin: Skin is warm and dry. No rash noted. No erythema.  Psychiatric: He has a normal mood and affect. His behavior is normal.    Assessment/Plan: 1. Acute renal failure: His history of hematuria and the rather rapid worsening of the renal function point towards possible RPGN although I cannot entirely rule out the possibilities of acute tubular necrosis (hemodynamically mediated by recent URI) or acute interstitial nephritis from recent medication exposures. Serologies have been requested including antinuclear antibody, ANCA and complement levels. I will rule out plasma cell dyscrasia with an SPEP/free light chains and request a renal ultrasound to rule  out possible obstructive uropathy given the palpable bladder. Urine electrolytes will be checked including urine sodium/creatinine and a urine protein/creatinine ratio be quantified. A trial of intravenous fluids will be attempted overnight with normal saline at 150 mL per hour. He does not have any acute dialysis needs at this time (we discussed the possible need for dialysis). May need a renal biopsy for definitive diagnosis and prognosis based on lab data from today. 2. Anemia: Hemoglobin apparently dropped from 12-9.9 over the past month or so-we'll check iron studies and recheck urinalysis for possible hematuria. No indications at this time for chest x-ray but may be important if [place] suspected (he does not have symptoms at this time). 3. Hyperkalemia: Mild, monitor with intravenous fluid therapy 4. Hypertension: May indeed be situational as he has always had good blood pressure control when checked as an  outpatient-this may be partly from anxiety but cannot rule out the impact of his acute renal failure and the possibility of a nephritic syndrome without more data.  Sonia Bromell K. 10/04/2014, 8:26 PM

## 2014-10-04 NOTE — ED Provider Notes (Signed)
CSN: [id number]     Arrival date & time 10/04/14  1924 History   First MD Initiated Contact with Patient 10/04/14 2002     Chief Complaint  Patient presents with  . Abnormal Lab     (Consider location/radiation/quality/duration/timing/severity/associated sxs/prior Treatment) HPI Comments: Andrew Coffey is a 64 y.o. male with a PMHx of esophageal stricture, chronic headaches, and R eye macular hole s/p repair, who presents to the ED after being sent here by Dr. Josetta Huddle his PCP at [facility], who found him to have abnormal labs. His H/H were 9.9/30.4 respectively, his Cr was 7.3, and BUN 108. The note he brings with him from his PCP states that his symptoms included increased thirst, enuresis, fatigue, with a coppery taste in his mouth. Patient reports to me that the symptoms have been ongoing for 3 weeks which prompted him to see his regular doctor today. Additionally reports that when he is climbing stairs or exerting himself, he feels somewhat short of breath and fatigue. Denies any fevers, chills, chest pain, shortness of breath at this time, abdominal pain, nausea, vomiting, diarrhea, hematochezia, melena, hematuria, dysuria, numbness, tingling, or weakness. States his urinary frequency is only at night. Denies any pain at this time.   The history is provided by the patient. No language interpreter was used.    Past Medical History  Diagnosis Date  . Esophageal stricture   . Headache(784.0)   . Dysphagia   . Macular hole of right eye     repaired 2011   Past Surgical History  Procedure Laterality Date  . Colonscopy    . Appendectomy    . Esophageal dilitation    . Esophagogastroduodenoscopy  10/22/2011    Procedure: ESOPHAGOGASTRODUODENOSCOPY (EGD);  Surgeon: Arta Silence, MD;  Location: [facility] ENDOSCOPY;  Service: Endoscopy;  Laterality: N/A;  . Pars plana vitrectomy w/ repair of macular hole    . Eye surgery    . Cataract extraction      RT eye  . Esophagogastroduodenoscopy   11/21/2011    Procedure: ESOPHAGOGASTRODUODENOSCOPY (EGD);  Surgeon: Jeryl Columbia, MD;  Location: [facility] ENDOSCOPY;  Service: Endoscopy;  Laterality: N/A;  have balloons available. will need c-arm  . Savory dilation  11/21/2011    Procedure: SAVORY DILATION;  Surgeon: Jeryl Columbia, MD;  Location: [facility] ENDOSCOPY;  Service: Endoscopy;  Laterality: N/A;   History reviewed. No pertinent family history. History  Substance Use Topics  . Smoking status: Never Smoker   . Smokeless tobacco: Not on file  . Alcohol Use: Yes     Comment: occasional    Review of Systems  Constitutional: Positive for fatigue. Negative for fever and chills.  Respiratory: Positive for shortness of breath (with exertion only, feels fatigued). Negative for wheezing.   Cardiovascular: Negative for chest pain.  Gastrointestinal: Negative for nausea, vomiting, abdominal pain, diarrhea and blood in stool.  Endocrine: Positive for polydipsia.  Genitourinary: Positive for frequency (only at night) and enuresis. Negative for dysuria and hematuria.  Musculoskeletal: Negative for myalgias and arthralgias.  Skin: Negative for color change.  Allergic/Immunologic: Negative for immunocompromised state.  Neurological: Negative for weakness and numbness.   10 Systems reviewed and are negative for acute change except as noted in the HPI.   Allergies  Review of patient's allergies indicates no known allergies.  Home Medications   Prior to Admission medications   Medication Sig Start Date End Date Taking? Authorizing Provider  isometheptene-acetaminophen-dichloralphenazone (MIDRIN) 65-325-100 MG capsule Take 1 capsule by mouth 4 (  four) times daily as needed. For Migraines   Yes Historical Provider, MD  Polyethyl Glycol-Propyl Glycol (SYSTANE) 0.4-0.3 % SOLN Place 1 drop into both eyes 2 (two) times daily as needed. For dry eyes   Yes Historical Provider, MD  omeprazole (PRILOSEC) 20 MG capsule Take 1 capsule (20 mg total) by mouth daily.  10/22/11 10/21/12  Arta Silence, MD   BP 161/89 mmHg  Pulse 60  Temp(Src) 98.1 F (36.7 C) (Oral)  Ht 5' 10.5" (1.791 m)  Wt 180 lb (81.647 kg)  BMI 25.45 kg/m2  SpO2 100% Physical Exam  Constitutional: He is oriented to person, place, and time. Vital signs are normal. He appears well-developed and well-nourished.  Non-toxic appearance. No distress.  Afebrile, nontoxic, NAD  HENT:  Head: Normocephalic and atraumatic.  Mouth/Throat: Oropharynx is clear and moist and mucous membranes are normal.  Eyes: Conjunctivae and EOM are normal. Right eye exhibits no discharge. Left eye exhibits no discharge.  Neck: Normal range of motion. Neck supple.  Cardiovascular: Normal rate, regular rhythm, normal heart sounds and intact distal pulses.  Exam reveals no gallop and no friction rub.   No murmur heard. Pulmonary/Chest: Effort normal and breath sounds normal. No respiratory distress. He has no decreased breath sounds. He has no wheezes. He has no rhonchi. He has no rales.  Abdominal: Soft. Normal appearance and bowel sounds are normal. He exhibits no distension. There is no tenderness. There is no rigidity, no rebound, no guarding, no CVA tenderness, no tenderness at McBurney's point and negative Murphy's sign.  Musculoskeletal: Normal range of motion.  MAE x4  Neurological: He is alert and oriented to person, place, and time. He has normal strength. No sensory deficit.  Skin: Skin is warm, dry and intact. No rash noted.  Psychiatric: He has a normal mood and affect.  Nursing note and vitals reviewed.   ED Course  Procedures (including critical care time) Labs Review Labs Reviewed  CBC WITH DIFFERENTIAL/PLATELET  COMPREHENSIVE METABOLIC PANEL  URINALYSIS, ROUTINE W REFLEX MICROSCOPIC  SODIUM, URINE, RANDOM  CREATININE, URINE, RANDOM  MPO/PR-3 (ANCA) ANTIBODIES  GLOMERULAR BASEMENT MEMBRANE ANTIBODIES  C3 COMPLEMENT  C4 COMPLEMENT  FERRITIN  IRON AND TIBC  PROTEIN ELECTROPHORESIS,  SERUM  KAPPA/LAMBDA LIGHT CHAINS  RENAL FUNCTION PANEL    Imaging Review No results found.   EKG Interpretation None      MDM   Final diagnoses:  ARF (acute renal failure)  Esophageal stricture    64 y.o. male here after being sent by PCP Dr. Josetta Huddle for abnormal labs, kidney function elevated. He had been having urinary frequency at night and fatigue as well as a copper taste in his mouth, per note. When I went to evaluate pt, Dr. Posey Pronto of [place] [facility] already in to see the pt and requested that I place the consult for admission. He has already placed orders for the pt. After evaluating pt, he is stable with no abdominal tenderness, clear lung exam. Will consult for admission.  8:30 PM Dr. Blaine Hamper saw pt and placed admission orders. Please see his dictation for further documentation of care.   BP 159/87 mmHg  Pulse 62  Temp(Src) 98.1 F (36.7 C) (Oral)  Resp 15  Ht 5' 10.5" (1.791 m)  Wt 180 lb (81.647 kg)  BMI 25.45 kg/m2  SpO2 100%   Stephen Baruch Camprubi-Soms, PA-C 10/04/14 2112  Charlesetta Shanks, MD 10/04/14 2142

## 2014-10-04 NOTE — ED Notes (Signed)
Patient here after doctors office visit today. Lab work at PCP showed concern for acute renal failure. Creatine and BUN: 7.3 and 108 from PCP paperwork. Patient went to PCP for concerns of urinary frequency, indigestion, and fatigue.

## 2014-10-04 NOTE — H&P (Signed)
[facility] History and Physical  Andrew Coffey WPY:[id number] DOB: 27-May-1951 DOA: 10/04/2014  Referring physician: ED physician PCP: [name], MD  Specialists:   Chief Complaint: Abnormal lab  HPI: Andrew Coffey is a 64 y.o. male with PMHx of esophageal stricture, chronic headaches, and R eye macular hole s/p repair, who presents to the ED after being sent here by Dr. Josetta Huddle his PCP at [facility], who found patient to have abnormal labs.   Patient reports that he has been having some nonspecific symptoms, including polyuria in the night, increased thirst coppery taste in his mouth in the past several weeks. He visited his PCP today, who found the patient has new anemia with Hgb of 9.9 and acute renal injury with Cr 7.3 and BUN 108. Patient was sent here for further evaluation and treatment. Patient reports that he had a normal colonoscopy approximately 3 years ago. He does not have hematuria or hematochezia, no dizziness, shortness of breath or chest pain. No tenderness over flank areas. No recent injury to the kidney areas. He has chronic intermittent headache, but no headache today. Patient denies fever, chills, cough, chest pain, SOB, abdominal pain, diarrhea, constipation, skin rashes, joint pain or leg swelling. No unilateral weakness, numbness or tingling sensations. No vision change or hearing loss.  Of note, patient used Advil Cold on 2/14 for cold likely symptoms, that led to problems with poor urine output/urinary hesitancy. His urinary symptoms resolved after discontinuation of Advil. He was evaluated in the past by Dr. Risa Grill for microscopic hematuria 4 years ago and more recently 1-1/2 years ago. Urology workup was negative for a source of his hematuria.  In ED, patient was found to have hemoglobin drop from 13.3 on 03/18/2008 to 9.5 today. Creatinine 7.13, BUN 106. Patient is admitted to inpatient for further evaluation and treatment. Nephrology was  consulted.  Review of Systems: As presented in the history of presenting illness, rest negative.  Where does patient live?  At home Can patient participate in ADLs? Yes  Allergy: No Known Allergies  Past Medical History  Diagnosis Date  . Esophageal stricture   . Headache(784.0)   . Dysphagia   . Macular hole of right eye     repaired 2011  . GERD (gastroesophageal reflux disease)     Past Surgical History  Procedure Laterality Date  . Colonscopy    . Appendectomy    . Esophageal dilitation    . Esophagogastroduodenoscopy  10/22/2011    Procedure: ESOPHAGOGASTRODUODENOSCOPY (EGD);  Surgeon: Arta Silence, MD;  Location: [facility];  Service: Endoscopy;  Laterality: N/A;  . Pars plana vitrectomy w/ repair of macular hole    . Eye surgery    . Cataract extraction      RT eye  . Esophagogastroduodenoscopy  11/21/2011    Procedure: ESOPHAGOGASTRODUODENOSCOPY (EGD);  Surgeon: Jeryl Columbia, MD;  Location: [facility] ENDOSCOPY;  Service: Endoscopy;  Laterality: N/A;  have balloons available. will need c-arm  . Savory dilation  11/21/2011    Procedure: SAVORY DILATION;  Surgeon: Jeryl Columbia, MD;  Location: [facility] ENDOSCOPY;  Service: Endoscopy;  Laterality: N/A;    Social History:  reports that he has never smoked. He does not have any smokeless tobacco history on file. He reports that he drinks alcohol. He reports that he does not use illicit drugs.  Family History:  Family History  Problem Relation Age of Onset  . Thrombocytopenia Mother     died of ITP  . Heart attack Father   .  Thyroid disease Sister      Prior to Admission medications   Medication Sig Start Date End Date Taking? Authorizing Provider  isometheptene-acetaminophen-dichloralphenazone (MIDRIN) 65-325-100 MG capsule Take 1 capsule by mouth 4 (four) times daily as needed. For Migraines   Yes Historical Provider, MD  Polyethyl Glycol-Propyl Glycol (SYSTANE) 0.4-0.3 % SOLN Place 1 drop into both eyes 2 (two) times daily  as needed. For dry eyes   Yes Historical Provider, MD  omeprazole (PRILOSEC) 20 MG capsule Take 1 capsule (20 mg total) by mouth daily. 10/22/11 10/21/12  Arta Silence, MD    Physical Exam: Filed Vitals:   10/04/14 1946 10/04/14 2026 10/04/14 2115  BP: 161/89 159/87 159/91  Pulse: 60 62 65  Temp: 98.1 F (36.7 C)    TempSrc: Oral    Resp:  15 23  Height: 5' 10.5" (1.791 m)    Weight: 81.647 kg (180 lb)    SpO2: 100% 100% 98%   General: Not in acute distress. Pale looking HEENT:       Eyes: PERRL, EOMI, no scleral icterus       ENT: No discharge from the ears and nose, no pharynx injection, no tonsillar enlargement.        Neck: No JVD, no bruit, no mass felt. Cardiac: S1/S2, RRR, No murmurs, No gallops or rubs Pulm: Good air movement bilaterally. Clear to auscultation bilaterally. No rales, wheezing, rhonchi or rubs. Abd: Soft, nondistended, nontender, no rebound pain, no organomegaly, BS present Ext: No edema bilaterally. 2+DP/PT pulse bilaterally Musculoskeletal: No joint deformities, erythema, or stiffness, ROM full Skin: No rashes.  Neuro: Alert and oriented X3, cranial nerves II-XII grossly intact, muscle strength 5/5 in all extremeties, sensation to light touch intact.  Psych: Patient is not psychotic, no suicidal or hemocidal ideation.  Labs on Admission:  Basic Metabolic Panel:  Recent Labs Lab 10/04/14 1952  NA 139  K 4.8  CL 103  CO2 22  GLUCOSE 111*  BUN 106*  CREATININE 7.13*  CALCIUM 9.8   Liver Function Tests:  Recent Labs Lab 10/04/14 1952  AST 27  ALT 22  ALKPHOS 36*  BILITOT 0.5  PROT 7.7  ALBUMIN 3.9   No results for input(s): LIPASE, AMYLASE in the last 168 hours. No results for input(s): AMMONIA in the last 168 hours. CBC:  Recent Labs Lab 10/04/14 1952  WBC 5.5  NEUTROABS 3.6  HGB 9.5*  HCT 28.5*  MCV 87.4  PLT 158   Cardiac Enzymes: No results for input(s): CKTOTAL, CKMB, CKMBINDEX, TROPONINI in the last 168 hours.  BNP  (last 3 results) No results for input(s): BNP in the last 8760 hours.  ProBNP (last 3 results) No results for input(s): PROBNP in the last 8760 hours.  CBG: No results for input(s): GLUCAP in the last 168 hours.  Radiological Exams on Admission: No results found.  EKG: will get one  Assessment/Plan Principal Problem:   Acute renal failure Active Problems:   GERD (gastroesophageal reflux disease)   Anemia   Chronic headache   Elevated blood pressure  Acute renal failure: Etiology is not clear. Nephrology was consulted. Dr. Posey Pronto saw patient. Differential diagnosis include RPGN, acute tubular necrosis and acute interstitial nephritis from recent medication exposures per Dr. Posey Pronto. - will admit to med-surg bed - Appreciate nephrology's consultation, will follow-up recommendations as follows: - check antinuclear antibody, ANCA, complement levels, SPEP/free light chains - renal US to r/o obstructive uropathy. - urine sodium/creatinine and urine protein/creatinine ratio be quantified. - A  trial of intravenous fluids will be attempted overnight with normal saline at 150 mL per hour. - UA  Anemia: Hemoglobin 9.5, MCV 87.4.  -FOBT -Haptoglobin and LDH to r/o hemolysis though less likely given normal total bilirubin -Iron, TIBC and Ferrin per Dr. Posey Pronto  Elevated blood pressure:  No history of hypertension: Blood pressure is 159/87 on admission. May be partly from anxiety but cannot rule out the impact of his acute renal failure. -IV hydralazine when necessary  GERD: On omeprazole at home. Patient does not have symptoms currently. -Will hold omeprazole since PPI has side effects of causing interstitial nephritis.  Chronic headache: Stable. No headache today. -Hold home Midrin -Tylenol when necessary  DVT ppx: SQ Heparin     Code Status: Full code Family Communication: None at bed side.  Disposition Plan: Admit to inpatient   Date of Service 10/04/2014    Ivor Costa [facility] Pager [phone]  If 7PM-7AM, please contact night-coverage [email or web address] Password [facility] 10/04/2014, 9:30 PM

## 2014-10-05 ENCOUNTER — Inpatient Hospital Stay (HOSPITAL_COMMUNITY): Payer: BLUE CROSS/BLUE SHIELD

## 2014-10-05 LAB — IRON AND TIBC
Iron: 36 ug/dL — ABNORMAL LOW (ref 42–165)
Saturation Ratios: 11 % — ABNORMAL LOW (ref 20–55)
TIBC: 332 ug/dL (ref 215–435)
UIBC: 296 ug/dL (ref 125–400)

## 2014-10-05 LAB — FERRITIN: Ferritin: 143 ng/mL (ref 22–322)

## 2014-10-05 LAB — CBC
HEMATOCRIT: 28.5 % — AB (ref 39.0–52.0)
HEMOGLOBIN: 9.4 g/dL — AB (ref 13.0–17.0)
MCH: 29.4 pg (ref 26.0–34.0)
MCHC: 33 g/dL (ref 30.0–36.0)
MCV: 89.1 fL (ref 78.0–100.0)
PLATELETS: 153 10*3/uL (ref 150–400)
RBC: 3.2 MIL/uL — ABNORMAL LOW (ref 4.22–5.81)
RDW: 14 % (ref 11.5–15.5)
WBC: 6.1 10*3/uL (ref 4.0–10.5)

## 2014-10-05 LAB — RENAL FUNCTION PANEL
ALBUMIN: 3.6 g/dL (ref 3.5–5.2)
ANION GAP: 12 (ref 5–15)
BUN: 104 mg/dL — ABNORMAL HIGH (ref 6–23)
CALCIUM: 9.2 mg/dL (ref 8.4–10.5)
CO2: 20 mmol/L (ref 19–32)
Chloride: 107 mmol/L (ref 96–112)
Creatinine, Ser: 7.31 mg/dL — ABNORMAL HIGH (ref 0.50–1.35)
GFR, EST AFRICAN AMERICAN: 8 mL/min — AB (ref >90)
GFR, EST NON AFRICAN AMERICAN: 7 mL/min — AB (ref >90)
GLUCOSE: 108 mg/dL — AB (ref 70–99)
PHOSPHORUS: 7 mg/dL — AB (ref 2.3–4.6)
POTASSIUM: 5 mmol/L (ref 3.5–5.1)
SODIUM: 139 mmol/L (ref 135–145)

## 2014-10-05 LAB — HEMOGLOBIN AND HEMATOCRIT, BLOOD
HCT: 28.6 % — ABNORMAL LOW (ref 39.0–52.0)
Hemoglobin: 9.6 g/dL — ABNORMAL LOW (ref 13.0–17.0)

## 2014-10-05 MED ORDER — TAMSULOSIN HCL 0.4 MG PO CAPS
0.4000 mg | ORAL_CAPSULE | Freq: Every day | ORAL | Status: DC
  Administered 2014-10-05 – 2014-10-08 (×4): 0.4 mg via ORAL
  Filled 2014-10-05 (×4): qty 1

## 2014-10-05 NOTE — Progress Notes (Signed)
[facility] PROGRESS NOTE  Andrew Coffey ERD:[id number] DOB: 07/11/1951 DOA: 10/04/2014 PCP: Andrew Screws, MD  Assessment/Plan:  Acute kidney injury- patient presented with acute kidney injury with elevated BUN and creatinine. Renal ultrasound shows significant bilateral hydronephrosis, bladder diverticulum as well as prostate enlargement. Foley catheter will be inserted today. We'll also consult urology.  Anemia Patient presented with hemoglobin 9.5 with MCV 87.4, FOBT has been ordered and is still pending. Follow CBC in a.m.  GERD Patient was taking omeprazole at home but PPI ER hold due to side effects of interstitial nephritis.  DVT prophylaxis Heparin  Code Status: Full code Family Communication: No family at bedside Disposition Plan: Home when stable   Consultants:  Nephrology  Procedures:  None  Antibiotics:  None  HPI/Subjective: 64 year old male who was sent to the hospital from the primary care office after he was found to have abnormal labs. Patient found to have acute kidney injury with creatinine 7.31 and BUN 108. Patient says that he took 10 days of ibuprofen cold and sinus tablets last month and after that had difficulty in urination. He was prescribed antibodies per the PCP but never recovered from it has continued to have increased frequency of urination and nocturia. In the hospital renal ultrasound was done which showed bilateral hydronephrosis, bladder diverticulum as well as prostate enlargement. Patient denies any complains this morning  Objective: Filed Vitals:   10/05/14 0549  BP: 158/81  Pulse: 61  Temp: 98.3 F (36.8 C)  Resp: 16    Intake/Output Summary (Last 24 hours) at 10/05/14 1310 Last data filed at 10/05/14 1124  Gross per 24 hour  Intake   1290 ml  Output   4650 ml  Net  -3360 ml   Filed Weights   10/04/14 1946  Weight: 81.647 kg (180 lb)    Exam:   General:  Appears in no acute  distress  Cardiovascular: S1-S2 is regular, no murmurs auscultated   Respiratory: Clear to auscultation bilaterally, no adventitious sounds heard   Abdomen: Soft, nontender, no organomegaly   Musculoskeletal: No edema noted in the lower extremities , moving all extremities  Data Reviewed: Basic Metabolic Panel:  Recent Labs Lab 10/04/14 1952 10/05/14 0710  NA 139 139  K 4.8 5.0  CL 103 107  CO2 22 20  GLUCOSE 111* 108*  BUN 106* 104*  CREATININE 7.13* 7.31*  CALCIUM 9.8 9.2  PHOS  --  7.0*   Liver Function Tests:  Recent Labs Lab 10/04/14 1952 10/05/14 0710  AST 27  --   ALT 22  --   ALKPHOS 36*  --   BILITOT 0.5  --   PROT 7.7  --   ALBUMIN 3.9 3.6   No results for input(s): LIPASE, AMYLASE in the last 168 hours. No results for input(s): AMMONIA in the last 168 hours. CBC:  Recent Labs Lab 10/04/14 1952 10/05/14 0710  WBC 5.5 6.1  NEUTROABS 3.6  --   HGB 9.5* 9.4*  HCT 28.5* 28.5*  MCV 87.4 89.1  PLT 158 153   Cardiac Enzymes: No results for input(s): CKTOTAL, CKMB, CKMBINDEX, TROPONINI in the last 168 hours. BNP (last 3 results) No results for input(s): BNP in the last 8760 hours.  ProBNP (last 3 results) No results for input(s): PROBNP in the last 8760 hours.  CBG: No results for input(s): GLUCAP in the last 168 hours.  No results found for this or any previous visit (from the past 240 hour(s)).   Studies: US Renal  10/05/2014   CLINICAL DATA:  Acute renal failure. Weakness and shortness of breath. History of renal stones.  EXAM: RENAL/URINARY TRACT ULTRASOUND COMPLETE  COMPARISON:  CT 12/13/2010  FINDINGS: Right Kidney:  Length: Enlarged measuring 14.2 cm. There is moderate hydronephrosis. The renal pelvis measures 4.6 cm proximally. No definite shadowing stones.  Left Kidney:  Length: Enlarged measuring 16.3 cm. There is moderate hydronephrosis. The renal pelvis measures 4.2 cm, proximal ureter measures 1.7 cm. Large cyst in the mid upper  kidney measures 10.1 x 11.7 x 10.1 cm. There is a 1.0 x 0.9 cm echogenic structure consistent with stone in the lower kidney.  Bladder:  Distended, bladder volume of 1,111 cc. Question of right-sided diverticulum measuring 1.5 x 1.5 x 1.8 cm. The prostate gland is enlarged measuring 6.2 x 6.7 by 5.9 cm with irregular borders, calcifications, and mass effect on the bladder base. The left distal ureter is seen, dilated in its course measuring 1.4-1.6 cm. The right distal ureter is dilated measuring 1.7 cm.  IMPRESSION: 1. Distended urinary bladder with questionable bladder diverticulum. There is moderate bilateral hydroureteronephrosis. Nonobstructing stone in the left kidney. 2. Enlarged irregular prostate gland with calcifications. Findings suggest bladder outlet obstruction. Correlation with PSA recommended. 3. Large left renal cyst.   Electronically Signed   By: Jeb Levering M.D.   On: 10/05/2014 05:46    Scheduled Meds: . heparin  5,000 Units Subcutaneous 3 times per day   Continuous Infusions:   Principal Problem:   Acute renal failure Active Problems:   GERD (gastroesophageal reflux disease)   Anemia   Chronic headache   Elevated blood pressure    Time spent: *25 min    [facility] Pager [phone]*. If 7PM-7AM, please contact night-coverage at [email or web address], password [facility] 10/05/2014, 1:10 PM  LOS: 1 day

## 2014-10-05 NOTE — Progress Notes (Signed)
Utilization review completed.  

## 2014-10-05 NOTE — Progress Notes (Signed)
[facility] ROUNDING NOTE   Subjective:   Interval History: about to have foley catheter placed    Objective:  Vital signs in last 24 hours:  Temp:  [98.1 F (36.7 C)-99.1 F (37.3 C)] 98.3 F (36.8 C) (03/24 0549) Pulse Rate:  [60-65] 61 (03/24 0549) Resp:  [15-23] 16 (03/24 0549) BP: (158-163)/(81-96) 158/81 mmHg (03/24 0549) SpO2:  [98 %-100 %] 99 % (03/24 0549) Weight:  [81.647 kg (180 lb)] 81.647 kg (180 lb) (03/23 1946)  Weight change:  Filed Weights   10/04/14 1946  Weight: 81.647 kg (180 lb)    Intake/Output: I/O last 3 completed shifts: In: 1290 [P.O.:110; I.V.:1180] Out: 1150 [Urine:1150]   Intake/Output this shift:  Total I/O In: -  Out: 1975 [Urine:1975]  CVS- RRR  Faint ejection systolic murmur RS- CTA ABD- BS present EXT- no edema   Basic Metabolic Panel:  Recent Labs Lab 10/04/14 1952 10/05/14 0710  NA 139 139  K 4.8 5.0  CL 103 107  CO2 22 20  GLUCOSE 111* 108*  BUN 106* 104*  CREATININE 7.13* 7.31*  CALCIUM 9.8 9.2  PHOS  --  7.0*    Liver Function Tests:  Recent Labs Lab 10/04/14 1952 10/05/14 0710  AST 27  --   ALT 22  --   ALKPHOS 36*  --   BILITOT 0.5  --   PROT 7.7  --   ALBUMIN 3.9 3.6   No results for input(s): LIPASE, AMYLASE in the last 168 hours. No results for input(s): AMMONIA in the last 168 hours.  CBC:  Recent Labs Lab 10/04/14 1952 10/05/14 0710  WBC 5.5 6.1  NEUTROABS 3.6  --   HGB 9.5* 9.4*  HCT 28.5* 28.5*  MCV 87.4 89.1  PLT 158 153    Cardiac Enzymes: No results for input(s): CKTOTAL, CKMB, CKMBINDEX, TROPONINI in the last 168 hours.  BNP: Invalid input(s): POCBNP  CBG: No results for input(s): GLUCAP in the last 168 hours.  Microbiology: No results found for this or any previous visit.  Coagulation Studies:  Recent Labs  10/04/14 2238  LABPROT 15.0  INR 1.17    Urinalysis:  Recent Labs  10/04/14 2142  COLORURINE YELLOW  LABSPEC 1.008  PHURINE 5.5   GLUCOSEU NEGATIVE  HGBUR MODERATE*  BILIRUBINUR NEGATIVE  KETONESUR NEGATIVE  PROTEINUR NEGATIVE  UROBILINOGEN 0.2  NITRITE NEGATIVE  LEUKOCYTESUR TRACE*      Imaging: US Renal  10/05/2014   CLINICAL DATA:  Acute renal failure. Weakness and shortness of breath. History of renal stones.  EXAM: RENAL/URINARY TRACT ULTRASOUND COMPLETE  COMPARISON:  CT 12/13/2010  FINDINGS: Right Kidney:  Length: Enlarged measuring 14.2 cm. There is moderate hydronephrosis. The renal pelvis measures 4.6 cm proximally. No definite shadowing stones.  Left Kidney:  Length: Enlarged measuring 16.3 cm. There is moderate hydronephrosis. The renal pelvis measures 4.2 cm, proximal ureter measures 1.7 cm. Large cyst in the mid upper kidney measures 10.1 x 11.7 x 10.1 cm. There is a 1.0 x 0.9 cm echogenic structure consistent with stone in the lower kidney.  Bladder:  Distended, bladder volume of 1,111 cc. Question of right-sided diverticulum measuring 1.5 x 1.5 x 1.8 cm. The prostate gland is enlarged measuring 6.2 x 6.7 by 5.9 cm with irregular borders, calcifications, and mass effect on the bladder base. The left distal ureter is seen, dilated in its course measuring 1.4-1.6 cm. The right distal ureter is dilated measuring 1.7 cm.  IMPRESSION: 1. Distended urinary bladder with questionable  bladder diverticulum. There is moderate bilateral hydroureteronephrosis. Nonobstructing stone in the left kidney. 2. Enlarged irregular prostate gland with calcifications. Findings suggest bladder outlet obstruction. Correlation with PSA recommended. 3. Large left renal cyst.   Electronically Signed   By: Jeb Levering M.D.   On: 10/05/2014 05:46     Medications:     . heparin  5,000 Units Subcutaneous 3 times per day   acetaminophen **OR** acetaminophen, hydrALAZINE, polyvinyl alcohol  Assessment/ Plan:   Acute renal failure with pending serological evaluation . Bilateral hydronephrosis and bladder distention noted. Recommend  urological evaluation  Call Dr Risa Grill ( patients urologist)    Anemia awaiting iron studies  ARF appears to be obstructive uropathy   LOS: 1 Darlin Stenseth W @TODAY @10 :38 AM

## 2014-10-05 NOTE — Consult Note (Signed)
Urology Consult  Referring physician: G Lama Reason for referral: hydrobnephrosis  Chief Complaint: Bilateral hydrohephrosis  History of Present Illness: REnal u/sound noted bilateral hydro and bladder tic and BPH; has foley; admitted for medical issues; Cr 7.31; nephrology assessed; prostate irregular; sees Grapey and normal PSA in clinic; normal work up for hematuria; cold meds lately and weaker stream; metallic taste etc and found to be in renal failure No urge/large volume; blood now with foley but not initial Modifying factors: There are no other modifying factors  Associated signs and symptoms: There are no other associated signs and symptoms Aggravating and relieving factors: There are no other aggravating or relieving factors Severity: Severe Duration: Persistent  Past Medical History  Diagnosis Date  . Esophageal stricture   . Dysphagia   . Macular hole of right eye     repaired 2011  . GERD (gastroesophageal reflux disease)   . Kidney stones ~ 2002  . Migraine     "probably 3 times/yr" (10/04/2014)  . Acute renal failure (ARF) 10/04/2014  . Basal cell carcinoma of face X 2   Past Surgical History  Procedure Laterality Date  . Colonoscopy    . Esophagogastroduodenoscopy (egd) with esophageal dilation  ~ 2010; 11/2011  . Esophagogastroduodenoscopy  10/22/2011    Procedure: ESOPHAGOGASTRODUODENOSCOPY (EGD);  Surgeon: William Outlaw, MD;  Location: [facility];  Service: Endoscopy;  Laterality: N/A;  . Pars plana vitrectomy w/ repair of macular hole Right ~ 08/2009  . Cataract extraction Right ~ 2012  . Esophagogastroduodenoscopy  11/21/2011    Procedure: ESOPHAGOGASTRODUODENOSCOPY (EGD);  Surgeon: Marc E Magod, MD;  Location: [facility];  Service: Endoscopy;  Laterality: N/A;  have balloons available.   . Savory dilation  11/21/2011    Procedure: SAVORY DILATION;  Surgeon: Marc E Magod, MD;  Location: [facility];  Service: Endoscopy;  Laterality: N/A;  . Tonsillectomy  ~  1960  . Lithotripsy  ~ 2002  . Eye surgery    . Mohs surgery  X 1  . Basal cell carcinoma excision  X 1    "face"    Medications: I have reviewed the patient's current medications. Allergies: No Known Allergies  Family History  Problem Relation Age of Onset  . Thrombocytopenia Mother     died of ITP  . Heart attack Father   . Thyroid disease Sister    Social History:  reports that he has never smoked. He has never used smokeless tobacco. He reports that he drinks about 3.6 oz of alcohol per week. He reports that he does not use illicit drugs.  ROS: All systems are reviewed and negative except as noted. Rest negative  Physical Exam:  Vital signs in last 24 hours: Temp:  [98.1 F (36.7 C)-99.1 F (37.3 C)] 98.3 F (36.8 C) (03/24 0549) Pulse Rate:  [60-65] 61 (03/24 0549) Resp:  [15-23] 16 (03/24 0549) BP: (158-163)/(81-96) 158/81 mmHg (03/24 0549) SpO2:  [98 %-100 %] 99 % (03/24 0549) Weight:  [81.647 kg (180 lb)] 81.647 kg (180 lb) (03/23 1946)  Cardiovascular: Skin warm; not flushed Respiratory: Breaths quiet; no shortness of breath Abdomen: No masses Neurological: Normal sensation to touch Musculoskeletal: Normal motor function arms and legs Lymphatics: No inguinal adenopathy Skin: No rashes Genitourinary:genitalia normal; foley  Laboratory Data:  Results for orders placed or performed during the hospital encounter of 10/04/14 (from the past 72 hour(s))  CBC with Differential     Status: Abnormal   Collection Time: 10/04/14  7:52 PM  Result Value   Ref Range   WBC 5.5 4.0 - 10.5 K/uL   RBC 3.26 (L) 4.22 - 5.81 MIL/uL   Hemoglobin 9.5 (L) 13.0 - 17.0 g/dL   HCT 28.5 (L) 39.0 - 52.0 %   MCV 87.4 78.0 - 100.0 fL   MCH 29.1 26.0 - 34.0 pg   MCHC 33.3 30.0 - 36.0 g/dL   RDW 13.8 11.5 - 15.5 %   Platelets 158 150 - 400 K/uL   Neutrophils Relative % 66 43 - 77 %   Neutro Abs 3.6 1.7 - 7.7 K/uL   Lymphocytes Relative 18 12 - 46 %   Lymphs Abs 1.0 0.7 - 4.0 K/uL    Monocytes Relative 13 (H) 3 - 12 %   Monocytes Absolute 0.7 0.1 - 1.0 K/uL   Eosinophils Relative 3 0 - 5 %   Eosinophils Absolute 0.2 0.0 - 0.7 K/uL   Basophils Relative 0 0 - 1 %   Basophils Absolute 0.0 0.0 - 0.1 K/uL  Comprehensive metabolic panel     Status: Abnormal   Collection Time: 10/04/14  7:52 PM  Result Value Ref Range   Sodium 139 135 - 145 mmol/L   Potassium 4.8 3.5 - 5.1 mmol/L   Chloride 103 96 - 112 mmol/L   CO2 22 19 - 32 mmol/L   Glucose, Bld 111 (H) 70 - 99 mg/dL   BUN 106 (H) 6 - 23 mg/dL   Creatinine, Ser 7.13 (H) 0.50 - 1.35 mg/dL   Calcium 9.8 8.4 - 10.5 mg/dL   Total Protein 7.7 6.0 - 8.3 g/dL   Albumin 3.9 3.5 - 5.2 g/dL   AST 27 0 - 37 U/L   ALT 22 0 - 53 U/L   Alkaline Phosphatase 36 (L) 39 - 117 U/L   Total Bilirubin 0.5 0.3 - 1.2 mg/dL   GFR calc non Af Amer 7 (L) >90 mL/min   GFR calc Af Amer 8 (L) >90 mL/min    Comment: (NOTE) The eGFR has been calculated using the CKD EPI equation. This calculation has not been validated in all clinical situations. eGFR's persistently <90 mL/min signify possible Chronic Kidney Disease.    Anion gap 14 5 - 15  Ferritin     Status: None   Collection Time: 10/04/14  8:30 PM  Result Value Ref Range   Ferritin 143 22 - 322 ng/mL    Comment: Performed at [facility]  Iron and TIBC     Status: Abnormal   Collection Time: 10/04/14  8:30 PM  Result Value Ref Range   Iron 36 (L) 42 - 165 ug/dL   TIBC 332 215 - 435 ug/dL   Saturation Ratios 11 (L) 20 - 55 %   UIBC 296 125 - 400 ug/dL    Comment: Performed at [facility]  Urinalysis, Routine w reflex microscopic     Status: Abnormal   Collection Time: 10/04/14  9:42 PM  Result Value Ref Range   Color, Urine YELLOW YELLOW   APPearance CLEAR CLEAR   Specific Gravity, Urine 1.008 1.005 - 1.030   pH 5.5 5.0 - 8.0   Glucose, UA NEGATIVE NEGATIVE mg/dL   Hgb urine dipstick MODERATE (A) NEGATIVE   Bilirubin Urine NEGATIVE NEGATIVE   Ketones, ur  NEGATIVE NEGATIVE mg/dL   Protein, ur NEGATIVE NEGATIVE mg/dL   Urobilinogen, UA 0.2 0.0 - 1.0 mg/dL   Nitrite NEGATIVE NEGATIVE   Leukocytes, UA TRACE (A) NEGATIVE  Sodium, urine, random       Status: None   Collection Time: 10/04/14  9:42 PM  Result Value Ref Range   Sodium, Ur 43 mmol/L  Creatinine, urine, random     Status: None   Collection Time: 10/04/14  9:42 PM  Result Value Ref Range   Creatinine, Urine 54.37 mg/dL  Protein / creatinine ratio, urine     Status: None   Collection Time: 10/04/14  9:42 PM  Result Value Ref Range   Creatinine, Urine 54.76 mg/dL   Total Protein, Urine <6 mg/dL    Comment: REPEATED TO VERIFY NO NORMAL RANGE ESTABLISHED FOR THIS TEST    Protein Creatinine Ratio        0.00 - 0.15    Comment: RESULT BELOW REPORTABLE RANGE, UNABLE TO CALCULATE.   Urine microscopic-add on     Status: None   Collection Time: 10/04/14  9:42 PM  Result Value Ref Range   Squamous Epithelial / LPF RARE RARE   WBC, UA 3-6 <3 WBC/hpf   RBC / HPF 0-2 <3 RBC/hpf   Bacteria, UA RARE RARE  Protime-INR     Status: None   Collection Time: 10/04/14 10:38 PM  Result Value Ref Range   Prothrombin Time 15.0 11.6 - 15.2 seconds   INR 1.17 0.00 - 1.49  Lactate dehydrogenase     Status: None   Collection Time: 10/04/14 10:38 PM  Result Value Ref Range   LDH 162 94 - 250 U/L  Renal function panel     Status: Abnormal   Collection Time: 10/05/14  7:10 AM  Result Value Ref Range   Sodium 139 135 - 145 mmol/L   Potassium 5.0 3.5 - 5.1 mmol/L   Chloride 107 96 - 112 mmol/L   CO2 20 19 - 32 mmol/L   Glucose, Bld 108 (H) 70 - 99 mg/dL   BUN 104 (H) 6 - 23 mg/dL   Creatinine, Ser 7.31 (H) 0.50 - 1.35 mg/dL   Calcium 9.2 8.4 - 10.5 mg/dL   Phosphorus 7.0 (H) 2.3 - 4.6 mg/dL   Albumin 3.6 3.5 - 5.2 g/dL   GFR calc non Af Amer 7 (L) >90 mL/min   GFR calc Af Amer 8 (L) >90 mL/min    Comment: (NOTE) The eGFR has been calculated using the CKD EPI equation. This calculation has  not been validated in all clinical situations. eGFR's persistently <90 mL/min signify possible Chronic Kidney Disease.    Anion gap 12 5 - 15  CBC     Status: Abnormal   Collection Time: 10/05/14  7:10 AM  Result Value Ref Range   WBC 6.1 4.0 - 10.5 K/uL   RBC 3.20 (L) 4.22 - 5.81 MIL/uL   Hemoglobin 9.4 (L) 13.0 - 17.0 g/dL   HCT 28.5 (L) 39.0 - 52.0 %   MCV 89.1 78.0 - 100.0 fL   MCH 29.4 26.0 - 34.0 pg   MCHC 33.0 30.0 - 36.0 g/dL   RDW 14.0 11.5 - 15.5 %   Platelets 153 150 - 400 K/uL   No results found for this or any previous visit (from the past 240 hour(s)). Creatinine:  Recent Labs  10/04/14 1952 10/05/14 0710  CREATININE 7.13* 7.31*    Xrays: See report/chart U/sound: see note  Impression/Assessment:  Bilateral hydro and renal failure  Plan:  Start flomax; leave foley in; irrigate cath PRN; send home with foley and flomax; follow up with Dr Grapey; will follow with Grapey on flomax and trial of voiding  MACDIARMID,SCOTT A 10/05/2014, 2:16   PM      

## 2014-10-05 NOTE — Progress Notes (Signed)
MD paged for urine output of 8156ml since 0945. Has also had dark red urine that started about 1200. Suggested getting H&H ordered since Hgb this AM was 9.4. Urology already aware of hematuria and increased output-spoke with urologist when she saw patient this afternoon. Passed along to oncoming nurse.

## 2014-10-06 LAB — COMPREHENSIVE METABOLIC PANEL
ALT: 19 U/L (ref 0–53)
ANION GAP: 13 (ref 5–15)
AST: 26 U/L (ref 0–37)
Albumin: 4 g/dL (ref 3.5–5.2)
Alkaline Phosphatase: 38 U/L — ABNORMAL LOW (ref 39–117)
BUN: 75 mg/dL — ABNORMAL HIGH (ref 6–23)
CO2: 24 mmol/L (ref 19–32)
Calcium: 9.9 mg/dL (ref 8.4–10.5)
Chloride: 103 mmol/L (ref 96–112)
Creatinine, Ser: 5.02 mg/dL — ABNORMAL HIGH (ref 0.50–1.35)
GFR, EST AFRICAN AMERICAN: 13 mL/min — AB (ref >90)
GFR, EST NON AFRICAN AMERICAN: 11 mL/min — AB (ref >90)
GLUCOSE: 163 mg/dL — AB (ref 70–99)
Potassium: 4.3 mmol/L (ref 3.5–5.1)
Sodium: 140 mmol/L (ref 135–145)
Total Bilirubin: 0.8 mg/dL (ref 0.3–1.2)
Total Protein: 8.4 g/dL — ABNORMAL HIGH (ref 6.0–8.3)

## 2014-10-06 LAB — PROTEIN ELECTROPHORESIS, SERUM
A/G Ratio: 1 (ref 0.7–2.0)
ALBUMIN ELP: 3.9 g/dL (ref 3.2–5.6)
Alpha-1-Globulin: 0.3 g/dL (ref 0.1–0.4)
Alpha-2-Globulin: 0.9 g/dL (ref 0.4–1.2)
Beta Globulin: 0.8 g/dL (ref 0.6–1.3)
GLOBULIN, TOTAL: 3.9 g/dL (ref 2.0–4.5)
Gamma Globulin: 1.9 g/dL — ABNORMAL HIGH (ref 0.5–1.6)
M-Spike, %: 1.6 g/dL — ABNORMAL HIGH
Total Protein ELP: 7.8 g/dL (ref 6.0–8.5)

## 2014-10-06 LAB — HAPTOGLOBIN: HAPTOGLOBIN: 247 mg/dL — AB (ref 34–200)

## 2014-10-06 LAB — HEMOGLOBIN A1C
Hgb A1c MFr Bld: 6.3 % — ABNORMAL HIGH (ref 4.8–5.6)
MEAN PLASMA GLUCOSE: 134 mg/dL

## 2014-10-06 LAB — CBC
HCT: 32.6 % — ABNORMAL LOW (ref 39.0–52.0)
Hemoglobin: 11 g/dL — ABNORMAL LOW (ref 13.0–17.0)
MCH: 29.5 pg (ref 26.0–34.0)
MCHC: 33.7 g/dL (ref 30.0–36.0)
MCV: 87.4 fL (ref 78.0–100.0)
Platelets: 207 10*3/uL (ref 150–400)
RBC: 3.73 MIL/uL — ABNORMAL LOW (ref 4.22–5.81)
RDW: 13.8 % (ref 11.5–15.5)
WBC: 8 10*3/uL (ref 4.0–10.5)

## 2014-10-06 LAB — KAPPA/LAMBDA LIGHT CHAINS
Kappa free light chain: 28.7 mg/L — ABNORMAL HIGH (ref 3.30–19.40)
Kappa, lambda light chain ratio: 2.07 — ABNORMAL HIGH (ref 0.26–1.65)
LAMDA FREE LIGHT CHAINS: 13.85 mg/L (ref 5.71–26.30)

## 2014-10-06 LAB — C4 COMPLEMENT: COMPLEMENT C4, BODY FLUID: 11 mg/dL — AB (ref 14–44)

## 2014-10-06 LAB — MPO/PR-3 (ANCA) ANTIBODIES

## 2014-10-06 LAB — OCCULT BLOOD X 1 CARD TO LAB, STOOL: FECAL OCCULT BLD: NEGATIVE

## 2014-10-06 LAB — GLOMERULAR BASEMENT MEMBRANE ANTIBODIES: GBM Ab: 4 units (ref 0–20)

## 2014-10-06 LAB — C3 COMPLEMENT: C3 Complement: 84 mg/dL (ref 82–167)

## 2014-10-06 MED ORDER — SODIUM CHLORIDE 0.45 % IV SOLN
INTRAVENOUS | Status: DC
  Administered 2014-10-06 – 2014-10-08 (×4): via INTRAVENOUS
  Filled 2014-10-06 (×8): qty 1000

## 2014-10-06 NOTE — Progress Notes (Signed)
[facility] PROGRESS NOTE  Andrew Coffey LFY:[id number] DOB: February 26, 1951 DOA: 10/04/2014 PCP: [name], MD  Assessment/Plan:  Acute kidney injury- today creatinine is 5.02, down from 7.31. patient presented with acute kidney injury with elevated BUN and creatinine. Renal ultrasound shows significant bilateral hydronephrosis, bladder diverticulum as well as prostate enlargement. Foley catheter in place. Urology has signed off they will follow the patient as outpatient.  Anemia Patient presented with hemoglobin 9.5 with MCV 87.4, FOBT has been ordered and is still pending. Today hemoglobin is 11.0  GERD Patient was taking omeprazole at home but PPI ER hold due to side effects of interstitial nephritis.  DVT prophylaxis SCDs  Code Status: Full code Family Communication: No family at bedside Disposition Plan: Home when stable   Consultants:  Nephrology  Procedures:  None  Antibiotics:  None  HPI/Subjective: 64 year old male who was sent to the hospital from the primary care office after he was found to have abnormal labs. Patient found to have acute kidney injury with creatinine 7.31 and BUN 108. Patient says that he took 10 days of ibuprofen cold and sinus tablets last month and after that had difficulty in urination. He was prescribed antibodies per the PCP but never recovered from it has continued to have increased frequency of urination and nocturia. In the hospital renal ultrasound was done which showed bilateral hydronephrosis, bladder diverticulum as well as prostate enlargement. Patient denies any complains this morning. He was seen by urology yesterday, Foley catheter inserted.  Objective: Filed Vitals:   10/06/14 0555  BP: 139/86  Pulse: 74  Temp: 98.7 F (37.1 C)  Resp: 16    Intake/Output Summary (Last 24 hours) at 10/06/14 1202 Last data filed at 10/06/14 1124  Gross per 24 hour  Intake   1420 ml  Output   9400 ml  Net  -7980 ml    Filed Weights   10/04/14 1946  Weight: 81.647 kg (180 lb)    Exam:   General:  Appears in no acute distress  Cardiovascular: S1-S2 is regular, no murmurs auscultated   Respiratory: Clear to auscultation bilaterally, no adventitious sounds heard   Abdomen: Soft, nontender, no organomegaly   Musculoskeletal: No edema noted in the lower extremities , moving all extremities  Data Reviewed: Basic Metabolic Panel:  Recent Labs Lab 10/04/14 1952 10/05/14 0710 10/06/14 0925  NA 139 139 140  K 4.8 5.0 4.3  CL 103 107 103  CO2 22 20 24   GLUCOSE 111* 108* 163*  BUN 106* 104* 75*  CREATININE 7.13* 7.31* 5.02*  CALCIUM 9.8 9.2 9.9  PHOS  --  7.0*  --    Liver Function Tests:  Recent Labs Lab 10/04/14 1952 10/05/14 0710 10/06/14 0925  AST 27  --  26  ALT 22  --  19  ALKPHOS 36*  --  38*  BILITOT 0.5  --  0.8  PROT 7.7  --  8.4*  ALBUMIN 3.9 3.6 4.0   No results for input(s): LIPASE, AMYLASE in the last 168 hours. No results for input(s): AMMONIA in the last 168 hours. CBC:  Recent Labs Lab 10/04/14 1952 10/05/14 0710 10/05/14 2125 10/06/14 0925  WBC 5.5 6.1  --  8.0  NEUTROABS 3.6  --   --   --   HGB 9.5* 9.4* 9.6* 11.0*  HCT 28.5* 28.5* 28.6* 32.6*  MCV 87.4 89.1  --  87.4  PLT 158 153  --  207   Cardiac Enzymes: No results for input(s): CKTOTAL, CKMB,  CKMBINDEX, TROPONINI in the last 168 hours. BNP (last 3 results) No results for input(s): BNP in the last 8760 hours.  ProBNP (last 3 results) No results for input(s): PROBNP in the last 8760 hours.  CBG: No results for input(s): GLUCAP in the last 168 hours.  No results found for this or any previous visit (from the past 240 hour(s)).   Studies: US Renal  10/05/2014   CLINICAL DATA:  Acute renal failure. Weakness and shortness of breath. History of renal stones.  EXAM: RENAL/URINARY TRACT ULTRASOUND COMPLETE  COMPARISON:  CT 12/13/2010  FINDINGS: Right Kidney:  Length: Enlarged measuring 14.2  cm. There is moderate hydronephrosis. The renal pelvis measures 4.6 cm proximally. No definite shadowing stones.  Left Kidney:  Length: Enlarged measuring 16.3 cm. There is moderate hydronephrosis. The renal pelvis measures 4.2 cm, proximal ureter measures 1.7 cm. Large cyst in the mid upper kidney measures 10.1 x 11.7 x 10.1 cm. There is a 1.0 x 0.9 cm echogenic structure consistent with stone in the lower kidney.  Bladder:  Distended, bladder volume of 1,111 cc. Question of right-sided diverticulum measuring 1.5 x 1.5 x 1.8 cm. The prostate gland is enlarged measuring 6.2 x 6.7 by 5.9 cm with irregular borders, calcifications, and mass effect on the bladder base. The left distal ureter is seen, dilated in its course measuring 1.4-1.6 cm. The right distal ureter is dilated measuring 1.7 cm.  IMPRESSION: 1. Distended urinary bladder with questionable bladder diverticulum. There is moderate bilateral hydroureteronephrosis. Nonobstructing stone in the left kidney. 2. Enlarged irregular prostate gland with calcifications. Findings suggest bladder outlet obstruction. Correlation with PSA recommended. 3. Large left renal cyst.   Electronically Signed   By: Jeb Levering M.D.   On: 10/05/2014 05:46    Scheduled Meds: . tamsulosin  0.4 mg Oral Daily   Continuous Infusions: . sodium chloride 0.45 % 1,000 mL with potassium chloride 20 mEq infusion 125 mL/hr at 10/06/14 1044    Principal Problem:   Acute renal failure Active Problems:   GERD (gastroesophageal reflux disease)   Anemia   Chronic headache   Elevated blood pressure    Time spent: *25 min    [facility]  [facility] Pager [phone]*. If 7PM-7AM, please contact night-coverage at [email or web address], password [facility] 10/06/2014, 12:02 PM  LOS: 2 days

## 2014-10-06 NOTE — Progress Notes (Signed)
  Subjective: Patient reports no new complaints or concerns. He continues to have a vigorous diuresis. Urine draining well from Foley catheter. Creatinine has decreased to 5.0.  Objective: Vital signs in last 24 hours: Temp:  [97.5 F (36.4 C)-99.4 F (37.4 C)] 97.5 F (36.4 C) (03/25 1333) Pulse Rate:  [60-74] 69 (03/25 1333) Resp:  [16-20] 20 (03/25 1333) BP: (139-172)/(86-98) 142/86 mmHg (03/25 1333) SpO2:  [97 %-99 %] 97 % (03/25 1333)  Intake/Output from previous day: 03/24 0701 - 03/25 0700 In: 1300 [P.O.:1200] Out: 11550 [Urine:11550] Intake/Output this shift: Total I/O In: 360 [P.O.:360] Out: 1350 [Urine:1350]  Physical Exam:  Constitutional: Vital signs reviewed. WD WN in NAD   Eyes: PERRL, No scleral icterus.   Cardiovascular: RRR Pulmonary/Chest: Normal effort Abdominal: Soft. Non-tender, non-distended Genitourinary: Indwelling catheter Extremities: No cyanosis or edema   Lab Results:  Recent Labs  10/05/14 0710 10/05/14 2125 10/06/14 0925  HGB 9.4* 9.6* 11.0*  HCT 28.5* 28.6* 32.6*   BMET  Recent Labs  10/05/14 0710 10/06/14 0925  NA 139 140  K 5.0 4.3  CL 107 103  CO2 20 24  GLUCOSE 108* 163*  BUN 104* 75*  CREATININE 7.31* 5.02*  CALCIUM 9.2 9.9    Recent Labs  10/04/14 2238  INR 1.17   No results for input(s): LABURIN in the last 72 hours. No results found for this or any previous visit.  Studies/Results: US Renal  10/05/2014   CLINICAL DATA:  Acute renal failure. Weakness and shortness of breath. History of renal stones.  EXAM: RENAL/URINARY TRACT ULTRASOUND COMPLETE  COMPARISON:  CT 12/13/2010  FINDINGS: Right Kidney:  Length: Enlarged measuring 14.2 cm. There is moderate hydronephrosis. The renal pelvis measures 4.6 cm proximally. No definite shadowing stones.  Left Kidney:  Length: Enlarged measuring 16.3 cm. There is moderate hydronephrosis. The renal pelvis measures 4.2 cm, proximal ureter measures 1.7 cm. Large cyst in the mid  upper kidney measures 10.1 x 11.7 x 10.1 cm. There is a 1.0 x 0.9 cm echogenic structure consistent with stone in the lower kidney.  Bladder:  Distended, bladder volume of 1,111 cc. Question of right-sided diverticulum measuring 1.5 x 1.5 x 1.8 cm. The prostate gland is enlarged measuring 6.2 x 6.7 by 5.9 cm with irregular borders, calcifications, and mass effect on the bladder base. The left distal ureter is seen, dilated in its course measuring 1.4-1.6 cm. The right distal ureter is dilated measuring 1.7 cm.  IMPRESSION: 1. Distended urinary bladder with questionable bladder diverticulum. There is moderate bilateral hydroureteronephrosis. Nonobstructing stone in the left kidney. 2. Enlarged irregular prostate gland with calcifications. Findings suggest bladder outlet obstruction. Correlation with PSA recommended. 3. Large left renal cyst.   Electronically Signed   By: Jeb Levering M.D.   On: 10/05/2014 05:46    Assessment/Plan:   Acute renal failure secondary to obstructive uropathy. Presumptively this problem is only been present for about a month and therefore I would expect return of renal function back to baseline. Certainly his electrolytes do need to be monitored given the elevation in his creatinine and the ensuing postobstructive diuresis. I suspect he can be discharged with the indwelling catheter once his creatinine is less than 2-3 range. He has follow-up arranged in our office for a voiding trial next week. He should be discharged home with tamsulosin.   LOS: 2 days   Clotilde Loth S 10/06/2014, 2:00 PM

## 2014-10-06 NOTE — Progress Notes (Signed)
[facility] ROUNDING NOTE   Subjective:   Interval History: post obstructive diuresis   11 L yesterday Labs pending today   Objective:  Vital signs in last 24 hours:  Temp:  [98.4 F (36.9 C)-99.4 F (37.4 C)] 98.7 F (37.1 C) (03/25 0555) Pulse Rate:  [60-74] 74 (03/25 0555) Resp:  [16-19] 16 (03/25 0555) BP: (139-172)/(86-98) 139/86 mmHg (03/25 0555) SpO2:  [97 %-99 %] 97 % (03/25 0555)  Weight change:  Filed Weights   10/04/14 1946  Weight: 81.647 kg (180 lb)    Intake/Output: I/O last 3 completed shifts: In: 2590 [P.O.:1310; I.V.:1180; Other:100] Out: 12700 [Urine:12700]   Intake/Output this shift:  Total I/O In: 360 [P.O.:360] Out: 700 [Urine:700]  CVS- RRR RS- CTA ABD- BS present soft non-distended  Foley  -- brown/red urine EXT- no edema   Basic Metabolic Panel:  Recent Labs Lab 10/04/14 1952 10/05/14 0710  NA 139 139  K 4.8 5.0  CL 103 107  CO2 22 20  GLUCOSE 111* 108*  BUN 106* 104*  CREATININE 7.13* 7.31*  CALCIUM 9.8 9.2  PHOS  --  7.0*    Liver Function Tests:  Recent Labs Lab 10/04/14 1952 10/05/14 0710  AST 27  --   ALT 22  --   ALKPHOS 36*  --   BILITOT 0.5  --   PROT 7.7  --   ALBUMIN 3.9 3.6   No results for input(s): LIPASE, AMYLASE in the last 168 hours. No results for input(s): AMMONIA in the last 168 hours.  CBC:  Recent Labs Lab 10/04/14 1952 10/05/14 0710 10/05/14 2125  WBC 5.5 6.1  --   NEUTROABS 3.6  --   --   HGB 9.5* 9.4* 9.6*  HCT 28.5* 28.5* 28.6*  MCV 87.4 89.1  --   PLT 158 153  --     Cardiac Enzymes: No results for input(s): CKTOTAL, CKMB, CKMBINDEX, TROPONINI in the last 168 hours.  BNP: Invalid input(s): POCBNP  CBG: No results for input(s): GLUCAP in the last 168 hours.  Microbiology: No results found for this or any previous visit.  Coagulation Studies:  Recent Labs  10/04/14 2238  LABPROT 15.0  INR 1.17    Urinalysis:  Recent Labs  10/04/14 2142   COLORURINE YELLOW  LABSPEC 1.008  PHURINE 5.5  GLUCOSEU NEGATIVE  HGBUR MODERATE*  BILIRUBINUR NEGATIVE  KETONESUR NEGATIVE  PROTEINUR NEGATIVE  UROBILINOGEN 0.2  NITRITE NEGATIVE  LEUKOCYTESUR TRACE*      Imaging: US Renal  10/05/2014   CLINICAL DATA:  Acute renal failure. Weakness and shortness of breath. History of renal stones.  EXAM: RENAL/URINARY TRACT ULTRASOUND COMPLETE  COMPARISON:  CT 12/13/2010  FINDINGS: Right Kidney:  Length: Enlarged measuring 14.2 cm. There is moderate hydronephrosis. The renal pelvis measures 4.6 cm proximally. No definite shadowing stones.  Left Kidney:  Length: Enlarged measuring 16.3 cm. There is moderate hydronephrosis. The renal pelvis measures 4.2 cm, proximal ureter measures 1.7 cm. Large cyst in the mid upper kidney measures 10.1 x 11.7 x 10.1 cm. There is a 1.0 x 0.9 cm echogenic structure consistent with stone in the lower kidney.  Bladder:  Distended, bladder volume of 1,111 cc. Question of right-sided diverticulum measuring 1.5 x 1.5 x 1.8 cm. The prostate gland is enlarged measuring 6.2 x 6.7 by 5.9 cm with irregular borders, calcifications, and mass effect on the bladder base. The left distal ureter is seen, dilated in its course measuring 1.4-1.6 cm. The right distal ureter is  dilated measuring 1.7 cm.  IMPRESSION: 1. Distended urinary bladder with questionable bladder diverticulum. There is moderate bilateral hydroureteronephrosis. Nonobstructing stone in the left kidney. 2. Enlarged irregular prostate gland with calcifications. Findings suggest bladder outlet obstruction. Correlation with PSA recommended. 3. Large left renal cyst.   Electronically Signed   By: Jeb Levering M.D.   On: 10/05/2014 05:46     Medications:   . sodium chloride 0.45 % 1,000 mL with potassium chloride 20 mEq infusion     . tamsulosin  0.4 mg Oral Daily   acetaminophen **OR** acetaminophen, hydrALAZINE, polyvinyl alcohol  Assessment/ Plan:   Post obstructive  renal failure  Labs pending this morning  Will start IV fluids to correct electrolyte abnormalities  Anemia stable   Urology has seen Mr Rahimi and have started flomax  I would anticipate that we can transition to a leg bag once urine output decreases   LOS: 2 Nakeysha Pasqual W @TODAY @10 :03 AM

## 2014-10-07 LAB — COMPREHENSIVE METABOLIC PANEL
ALT: 15 U/L (ref 0–53)
ANION GAP: 6 (ref 5–15)
AST: 19 U/L (ref 0–37)
Albumin: 3.8 g/dL (ref 3.5–5.2)
Alkaline Phosphatase: 36 U/L — ABNORMAL LOW (ref 39–117)
BILIRUBIN TOTAL: 0.6 mg/dL (ref 0.3–1.2)
BUN: 49 mg/dL — ABNORMAL HIGH (ref 6–23)
CO2: 31 mmol/L (ref 19–32)
Calcium: 8.9 mg/dL (ref 8.4–10.5)
Chloride: 105 mmol/L (ref 96–112)
Creatinine, Ser: 3.39 mg/dL — ABNORMAL HIGH (ref 0.50–1.35)
GFR calc Af Amer: 21 mL/min — ABNORMAL LOW (ref >90)
GFR, EST NON AFRICAN AMERICAN: 18 mL/min — AB (ref >90)
GLUCOSE: 114 mg/dL — AB (ref 70–99)
POTASSIUM: 4 mmol/L (ref 3.5–5.1)
SODIUM: 142 mmol/L (ref 135–145)
Total Protein: 8.1 g/dL (ref 6.0–8.3)

## 2014-10-07 MED ORDER — FINASTERIDE 5 MG PO TABS
5.0000 mg | ORAL_TABLET | Freq: Every day | ORAL | Status: DC
Start: 2014-10-07 — End: 2014-10-08
  Administered 2014-10-07 – 2014-10-08 (×2): 5 mg via ORAL
  Filled 2014-10-07 (×2): qty 1

## 2014-10-07 NOTE — Progress Notes (Signed)
  Subjective:  1 - Acute Renal Failure / Urinary Retention - Cr 8 on admit with bilateral hydro and urinary retention, now s/p catheter placement. Minimal baseline voiding complaints. Now started on tamsulosin + finasteride.   Today Fynn is w/o complaints. He continues to diurese well and GFR improving. NO gross hematuria or flank pain.   Objective: Vital signs in last 24 hours: Temp:  [97.5 F (36.4 C)-98.5 F (36.9 C)] 98.5 F (36.9 C) (03/26 0510) Pulse Rate:  [64-70] 64 (03/26 0510) Resp:  [12-20] 16 (03/26 0510) BP: (132-144)/(86-97) 132/97 mmHg (03/26 0510) SpO2:  [97 %] 97 % (03/26 0510) Last BM Date: 10/06/14  Intake/Output from previous day: 03/25 0701 - 03/26 0700 In: 3114.5 [P.O.:802; I.V.:2312.5] Out: 5350 [Urine:5350] Intake/Output this shift: Total I/O In: 360 [P.O.:360] Out: 800 [Urine:800]  General appearance: alert, cooperative and appears stated age Head: Normocephalic, without obvious abnormality, atraumatic Eyes: negative Neck: supple, symmetrical, trachea midline Back: symmetric, no curvature. ROM normal. No CVA tenderness. Resp: non-labored on room air Cardio: Nl rate GI: soft, non-tender; bowel sounds normal; no masses,  no organomegaly Male genitalia: normal Pulses: 2+ and symmetric Skin: Skin color, texture, turgor normal. No rashes or lesions Lymph nodes: Cervical, supraclavicular, and axillary nodes normal. Neurologic: Grossly normal  Foley c/d/i with clear yellow urine.   Lab Results:   Recent Labs  10/05/14 0710 10/05/14 2125 10/06/14 0925  WBC 6.1  --  8.0  HGB 9.4* 9.6* 11.0*  HCT 28.5* 28.6* 32.6*  PLT 153  --  207   BMET  Recent Labs  10/06/14 0925 10/07/14 0537  NA 140 142  K 4.3 4.0  CL 103 105  CO2 24 31  GLUCOSE 163* 114*  BUN 75* 49*  CREATININE 5.02* 3.39*  CALCIUM 9.9 8.9   PT/INR  Recent Labs  10/04/14 2238  LABPROT 15.0  INR 1.17   ABG No results for input(s): PHART, HCO3 in the last 72  hours.  Invalid input(s): PCO2, PO2  Studies/Results: No results found.  Anti-infectives: Anti-infectives    None      Assessment/Plan:  1 - Acute Renal Failure / Urinary Retention - Please continue tamsulosin and finasteride daily at discharge. Pt to follow up with Dr. Risa Grill in about 1 week with trial of void in office.   2 - Will follow prn while in house, please call with questions anytime.   [facility], Monesha Monreal 10/07/2014

## 2014-10-07 NOTE — Progress Notes (Signed)
[facility] PROGRESS NOTE  Andrew Coffey [id number] DOB: [birth year] DOA: 10/04/2014 PCP: [name], MD  Assessment/Plan:  Acute kidney injury- today creatinine is 3.39, down from 5.02 yesterday, patient presented with acute kidney injury with elevated BUN and creatinine. Renal ultrasound shows significant bilateral hydronephrosis, bladder diverticulum as well as prostate enlargement. Foley catheter in place. Urology has signed off they will follow the patient as outpatient. Will continue to monitor the patient's renal function the hospital. Nephrology also has signed off. Will cut down the fluids to 75 mL per hour as per nephrology recommendation.  Anemia Patient presented with hemoglobin 9.5 with MCV 87.4, FOBT has been ordered and is still pending. Yesterday 10/06/2014 hemoglobin was 11.0  GERD Patient was taking omeprazole at home but PPI ER hold due to side effects of interstitial nephritis.  DVT prophylaxis SCDs  Code Status: Full code Family Communication: No family at bedside Disposition Plan: Home when stable, possibly on Monday   Consultants:  Nephrology  Procedures:  None  Antibiotics:  None  HPI/Subjective: 64 year old male who was sent to the hospital from the primary care office after he was found to have abnormal labs. Patient found to have acute kidney injury with creatinine 7.31 and BUN 108. Patient says that he took 10 days of ibuprofen cold and sinus tablets last month and after that had difficulty in urination. He was prescribed antibodies per the PCP but never recovered from it has continued to have increased frequency of urination and nocturia. In the hospital renal ultrasound was done which showed bilateral hydronephrosis, bladder diverticulum as well as prostate enlargement. Patient denies any complains this morning. He was seen by urology , Foley catheter inserted. Renal function has not improved. Hematuria has  cleared.  Objective: Filed Vitals:   10/07/14 0510  BP: 132/97  Pulse: 64  Temp: 98.5 F (36.9 C)  Resp: 16    Intake/Output Summary (Last 24 hours) at 10/07/14 1044 Last data filed at 10/07/14 0920  Gross per 24 hour  Intake 3114.5 ml  Output   5450 ml  Net -2335.5 ml   Filed Weights   10/04/14 1946  Weight: 81.647 kg (180 lb)    Exam:   General:  Appears in no acute distress  Cardiovascular: S1-S2 is regular, no murmurs auscultated   Respiratory: Clear to auscultation bilaterally, no adventitious sounds heard   Abdomen: Soft, nontender, no organomegaly   Musculoskeletal: No edema noted in the lower extremities , moving all extremities  Data Reviewed: Basic Metabolic Panel:  Recent Labs Lab 10/04/14 1952 10/05/14 0710 10/06/14 0925 10/07/14 0537  NA 139 139 140 142  K 4.8 5.0 4.3 4.0  CL 103 107 103 105  CO2 22 20 24 31   GLUCOSE 111* 108* 163* 114*  BUN 106* 104* 75* 49*  CREATININE 7.13* 7.31* 5.02* 3.39*  CALCIUM 9.8 9.2 9.9 8.9  PHOS  --  7.0*  --   --    Liver Function Tests:  Recent Labs Lab 10/04/14 1952 10/05/14 0710 10/06/14 0925 10/07/14 0537  AST 27  --  26 19  ALT 22  --  19 15  ALKPHOS 36*  --  38* 36*  BILITOT 0.5  --  0.8 0.6  PROT 7.7  --  8.4* 8.1  ALBUMIN 3.9 3.6 4.0 3.8   No results for input(s): LIPASE, AMYLASE in the last 168 hours. No results for input(s): AMMONIA in the last 168 hours. CBC:  Recent Labs Lab 10/04/14 1952 10/05/14 0710 10/05/14 2125  10/06/14 0925  WBC 5.5 6.1  --  8.0  NEUTROABS 3.6  --   --   --   HGB 9.5* 9.4* 9.6* 11.0*  HCT 28.5* 28.5* 28.6* 32.6*  MCV 87.4 89.1  --  87.4  PLT 158 153  --  207   Cardiac Enzymes: No results for input(s): CKTOTAL, CKMB, CKMBINDEX, TROPONINI in the last 168 hours. BNP (last 3 results) No results for input(s): BNP in the last 8760 hours.  ProBNP (last 3 results) No results for input(s): PROBNP in the last 8760 hours.  CBG: No results for input(s):  GLUCAP in the last 168 hours.  No results found for this or any previous visit (from the past 240 hour(s)).   Studies: No results found.  Scheduled Meds: . tamsulosin  0.4 mg Oral Daily   Continuous Infusions: . sodium chloride 0.45 % 1,000 mL with potassium chloride 20 mEq infusion 125 mL/hr at 10/07/14 0241    Principal Problem:   Acute renal failure Active Problems:   GERD (gastroesophageal reflux disease)   Anemia   Chronic headache   Elevated blood pressure    Time spent: *25 min    [facility]  [facility] Pager [phone]*. If 7PM-7AM, please contact night-coverage at [email or web address], password [facility] 10/07/2014, 10:44 AM  LOS: 3 days

## 2014-10-07 NOTE — Progress Notes (Signed)
[facility] ROUNDING NOTE   Subjective:   Interval History: much better this morning diuresing well ( post obstructive )  Objective:  Vital signs in last 24 hours:  Temp:  [97.5 F (36.4 C)-98.5 F (36.9 C)] 98.5 F (36.9 C) (03/26 0510) Pulse Rate:  [64-70] 64 (03/26 0510) Resp:  [12-20] 16 (03/26 0510) BP: (132-144)/(86-97) 132/97 mmHg (03/26 0510) SpO2:  [97 %] 97 % (03/26 0510)  Weight change:  Filed Weights   10/04/14 1946  Weight: 81.647 kg (180 lb)    Intake/Output: I/O last 3 completed shifts: In: 3354.5 [P.O.:1042; I.V.:2312.5] Out: 9950 [Urine:9950]   Intake/Output this shift:  Total I/O In: 360 [P.O.:360] Out: 800 [Urine:800]  CVS- RRR RS- CTA ABD- BS present soft non-distended    Foley with pink " rose " color to urine EXT- no edema   Basic Metabolic Panel:  Recent Labs Lab 10/04/14 1952 10/05/14 0710 10/06/14 0925 10/07/14 0537  NA 139 139 140 142  K 4.8 5.0 4.3 4.0  CL 103 107 103 105  CO2 22 20 24 31   GLUCOSE 111* 108* 163* 114*  BUN 106* 104* 75* 49*  CREATININE 7.13* 7.31* 5.02* 3.39*  CALCIUM 9.8 9.2 9.9 8.9  PHOS  --  7.0*  --   --     Liver Function Tests:  Recent Labs Lab 10/04/14 1952 10/05/14 0710 10/06/14 0925 10/07/14 0537  AST 27  --  26 19  ALT 22  --  19 15  ALKPHOS 36*  --  38* 36*  BILITOT 0.5  --  0.8 0.6  PROT 7.7  --  8.4* 8.1  ALBUMIN 3.9 3.6 4.0 3.8   No results for input(s): LIPASE, AMYLASE in the last 168 hours. No results for input(s): AMMONIA in the last 168 hours.  CBC:  Recent Labs Lab 10/04/14 1952 10/05/14 0710 10/05/14 2125 10/06/14 0925  WBC 5.5 6.1  --  8.0  NEUTROABS 3.6  --   --   --   HGB 9.5* 9.4* 9.6* 11.0*  HCT 28.5* 28.5* 28.6* 32.6*  MCV 87.4 89.1  --  87.4  PLT 158 153  --  207    Cardiac Enzymes: No results for input(s): CKTOTAL, CKMB, CKMBINDEX, TROPONINI in the last 168 hours.  BNP: Invalid input(s): POCBNP  CBG: No results for input(s): GLUCAP  in the last 168 hours.  Microbiology: No results found for this or any previous visit.  Coagulation Studies:  Recent Labs  10/04/14 2238  LABPROT 15.0  INR 1.17    Urinalysis:  Recent Labs  10/04/14 2142  COLORURINE YELLOW  LABSPEC 1.008  PHURINE 5.5  GLUCOSEU NEGATIVE  HGBUR MODERATE*  BILIRUBINUR NEGATIVE  KETONESUR NEGATIVE  PROTEINUR NEGATIVE  UROBILINOGEN 0.2  NITRITE NEGATIVE  LEUKOCYTESUR TRACE*      Imaging: No results found.   Medications:   . sodium chloride 0.45 % 1,000 mL with potassium chloride 20 mEq infusion 125 mL/hr at 10/07/14 0241   . tamsulosin  0.4 mg Oral Daily   acetaminophen **OR** acetaminophen, hydrALAZINE, polyvinyl alcohol  Assessment/ Plan:   Post obstructive renal failure Labs pending this morning Will start IV fluids to correct electrolyte abnormalities. Reduce IV rate 75 cc/hr  Anemia stable   Urology has seen [name] and have started flomax  I would anticipate that we can transition to a leg bag once urine output decreases  I will sign off but would like patient to stay in until Monday to make sure electrolytes are  stable. I do not think there is any need for a nephrology follow up as I believe the renal fuction will return to normal range   LOS: 3 Andrew Coffey W @TODAY @9 :49 AM

## 2014-10-08 LAB — BASIC METABOLIC PANEL
Anion gap: 8 (ref 5–15)
BUN: 34 mg/dL — AB (ref 6–23)
CALCIUM: 8.2 mg/dL — AB (ref 8.4–10.5)
CHLORIDE: 107 mmol/L (ref 96–112)
CO2: 26 mmol/L (ref 19–32)
CREATININE: 2.35 mg/dL — AB (ref 0.50–1.35)
GFR calc non Af Amer: 28 mL/min — ABNORMAL LOW (ref >90)
GFR, EST AFRICAN AMERICAN: 32 mL/min — AB (ref >90)
Glucose, Bld: 102 mg/dL — ABNORMAL HIGH (ref 70–99)
POTASSIUM: 3.8 mmol/L (ref 3.5–5.1)
Sodium: 141 mmol/L (ref 135–145)

## 2014-10-08 MED ORDER — TAMSULOSIN HCL 0.4 MG PO CAPS: 0.4000 mg | ORAL_CAPSULE | Freq: Every day | ORAL | Status: DC

## 2014-10-08 MED ORDER — FINASTERIDE 5 MG PO TABS: 5.0000 mg | ORAL_TABLET | Freq: Every day | ORAL | Status: DC

## 2014-10-08 NOTE — Discharge Summary (Signed)
Physician Discharge Summary  Andrew Coffey [id number] DOB: Nov 26, 1950 DOA: 10/04/2014  PCP: [name], MD  Admit date: 10/04/2014 Discharge date: 10/08/2014  Time spent: 25* minutes  Recommendations for Outpatient Follow-up:  1. *Follow up PCP in one week to check Bmet  Discharge Diagnoses:  Principal Problem:   Acute renal failure Active Problems:   GERD (gastroesophageal reflux disease)   Anemia   Chronic headache   Elevated blood pressure   Discharge Condition: Stable  Diet recommendation: Regular diet  Filed Weights   10/04/14 1946  Weight: 81.647 kg (180 lb)    History of present illness:  64 year old male who was sent to the hospital from the primary care office after he was found to have abnormal labs. Patient found to have acute kidney injury with creatinine 7.31 and BUN 108. Patient says that he took 10 days of ibuprofen cold and sinus tablets last month and after that had difficulty in urination. He was prescribed antibodies per the PCP but never recovered from it has continued to have increased frequency of urination and nocturia.  Hospital Course:  In the hospital renal ultrasound was done which showed bilateral hydronephrosis, bladder diverticulum as well as prostate enlargement. Patient denies any complains this morning. He was seen by urology , Foley catheter inserted. Renal function has now improved. Hematuria has cleared  Acute kidney injury- today creatinine is 2.35, down from 3.39 yesterday, patient presented with acute kidney injury with elevated BUN and creatinine. Renal ultrasound shows significant bilateral hydronephrosis, bladder diverticulum as well as prostate enlargement. Foley catheter in place. Urology has signed off they will follow the patient as outpatient. Patient to go home on Flomax and Finestride Patient will be discharged with foley. I called and discussed with Urologist Dr Tresa Moore and Nephrologist Dr Jonnie Finner, and they both have  cleared the patient for discharge.  Anemia Patient presented with hemoglobin 9.5 with MCV 87.4, FOBT was negative. Yesterday 10/06/2014 hemoglobin was 11.0  Procedures:  None  Consultations:  None  Discharge Exam: Filed Vitals:   10/08/14 0531  BP: 136/87  Pulse: 67  Temp: 98.9 F (37.2 C)  Resp: 18    General: Appear in no acute distress Cardiovascular: S1s2 RRR Respiratory: Clear bilaterally  Discharge Instructions   Discharge Instructions    Diet - low sodium heart healthy    Complete by:  As directed      Discharge instructions    Complete by:  As directed   Will need repeat Bmet in one week to check the kidney function     Increase activity slowly    Complete by:  As directed           Current Discharge Medication List    START taking these medications   Details  finasteride (PROSCAR) 5 MG tablet Take 1 tablet (5 mg total) by mouth daily. Qty: 30 tablet, Refills: 2    tamsulosin (FLOMAX) 0.4 MG CAPS capsule Take 1 capsule (0.4 mg total) by mouth daily. Qty: 30 capsule, Refills: 2      CONTINUE these medications which have NOT CHANGED   Details  isometheptene-acetaminophen-dichloralphenazone (MIDRIN) 65-325-100 MG capsule Take 1 capsule by mouth 4 (four) times daily as needed. For Migraines    Polyethyl Glycol-Propyl Glycol (SYSTANE) 0.4-0.3 % SOLN Place 1 drop into both eyes 2 (two) times daily as needed. For dry eyes    omeprazole (PRILOSEC) 20 MG capsule Take 1 capsule (20 mg total) by mouth daily.       No  Known Allergies    The results of significant diagnostics from this hospitalization (including imaging, microbiology, ancillary and laboratory) are listed below for reference.    Significant Diagnostic Studies: US Renal  10/05/2014   CLINICAL DATA:  Acute renal failure. Weakness and shortness of breath. History of renal stones.  EXAM: RENAL/URINARY TRACT ULTRASOUND COMPLETE  COMPARISON:  CT 12/13/2010  FINDINGS: Right Kidney:  Length:  Enlarged measuring 14.2 cm. There is moderate hydronephrosis. The renal pelvis measures 4.6 cm proximally. No definite shadowing stones.  Left Kidney:  Length: Enlarged measuring 16.3 cm. There is moderate hydronephrosis. The renal pelvis measures 4.2 cm, proximal ureter measures 1.7 cm. Large cyst in the mid upper kidney measures 10.1 x 11.7 x 10.1 cm. There is a 1.0 x 0.9 cm echogenic structure consistent with stone in the lower kidney.  Bladder:  Distended, bladder volume of 1,111 cc. Question of right-sided diverticulum measuring 1.5 x 1.5 x 1.8 cm. The prostate gland is enlarged measuring 6.2 x 6.7 by 5.9 cm with irregular borders, calcifications, and mass effect on the bladder base. The left distal ureter is seen, dilated in its course measuring 1.4-1.6 cm. The right distal ureter is dilated measuring 1.7 cm.  IMPRESSION: 1. Distended urinary bladder with questionable bladder diverticulum. There is moderate bilateral hydroureteronephrosis. Nonobstructing stone in the left kidney. 2. Enlarged irregular prostate gland with calcifications. Findings suggest bladder outlet obstruction. Correlation with PSA recommended. 3. Large left renal cyst.   Electronically Signed   By: Jeb Levering M.D.   On: 10/05/2014 05:46    Microbiology: No results found for this or any previous visit (from the past 240 hour(s)).   Labs: Basic Metabolic Panel:  Recent Labs Lab 10/04/14 1952 10/05/14 0710 10/06/14 0925 10/07/14 0537 10/08/14 0643  NA 139 139 140 142 141  K 4.8 5.0 4.3 4.0 3.8  CL 103 107 103 105 107  CO2 22 20 24 31 26   GLUCOSE 111* 108* 163* 114* 102*  BUN 106* 104* 75* 49* 34*  CREATININE 7.13* 7.31* 5.02* 3.39* 2.35*  CALCIUM 9.8 9.2 9.9 8.9 8.2*  PHOS  --  7.0*  --   --   --    Liver Function Tests:  Recent Labs Lab 10/04/14 1952 10/05/14 0710 10/06/14 0925 10/07/14 0537  AST 27  --  26 19  ALT 22  --  19 15  ALKPHOS 36*  --  38* 36*  BILITOT 0.5  --  0.8 0.6  PROT 7.7  --   8.4* 8.1  ALBUMIN 3.9 3.6 4.0 3.8   No results for input(s): LIPASE, AMYLASE in the last 168 hours. No results for input(s): AMMONIA in the last 168 hours. CBC:  Recent Labs Lab 10/04/14 1952 10/05/14 0710 10/05/14 2125 10/06/14 0925  WBC 5.5 6.1  --  8.0  NEUTROABS 3.6  --   --   --   HGB 9.5* 9.4* 9.6* 11.0*  HCT 28.5* 28.5* 28.6* 32.6*  MCV 87.4 89.1  --  87.4  PLT 158 153  --  207   Cardiac Enzymes: No results for input(s): CKTOTAL, CKMB, CKMBINDEX, TROPONINI in the last 168 hours. BNP: BNP (last 3 results) No results for input(s): BNP in the last 8760 hours.  ProBNP (last 3 results) No results for input(s): PROBNP in the last 8760 hours.  CBG: No results for input(s): GLUCAP in the last 168 hours.     SignedEleonore Chiquito S  [facility] 10/08/2014, 12:56 PM

## 2014-10-08 NOTE — Progress Notes (Signed)
NURSING PROGRESS NOTE  Andrew Coffey [id number] Discharge Data: 10/08/2014 3:41 PM Attending Provider: Oswald Hillock, MD HDQ:QIWLN,[id number] NEVILL, MD   Grier Mitts to be D/C'd Home per MD order.    All IV's will be discontinued and monitored for bleeding.  All belongings will be returned to patient for patient to take home. Patient verbalized and demonstrated urinary catheter care and leg bag exchange while maintaining sterility. Patient sent home with urinary leg bag and regular foley bag along with all supplies need for home care. Patient sent home with hard copies of all prescriptions.  Last Documented Vital Signs:  Blood pressure 138/69, pulse 71, temperature 98 F (36.7 C), temperature source Oral, resp. rate 16, height 5' 10.5" (1.791 m), weight 81.647 kg (180 lb), SpO2 99 %.  Andrew Limes RN, BS, BSN

## 2014-10-08 NOTE — Progress Notes (Signed)
  Subjective:  1 - Acute Renal Failure / Urinary Retention - Cr 8 on admit with bilateral hydro and urinary retention, now s/p catheter placement. Minimal baseline voiding complaints. Now started on tamsulosin + finasteride. Cr falling nicely with expected diuresis.   Today Andrew Coffey is stable. Labs today pending, diuresis slowing some. No complaints.   Objective: Vital signs in last 24 hours: Temp:  [98.2 F (36.8 C)-98.9 F (37.2 C)] 98.9 F (37.2 C) (03/27 0531) Pulse Rate:  [67-86] 67 (03/27 0531) Resp:  [18] 18 (03/27 0531) BP: (125-150)/(79-91) 136/87 mmHg (03/27 0531) SpO2:  [95 %-99 %] 97 % (03/27 0531) Last BM Date: 10/07/14 (per pt)  Intake/Output from previous day: 03/26 0701 - 03/27 0700 In: 3003.7 [P.O.:922; I.V.:2081.7] Out: 2420 [Urine:2420] Intake/Output this shift: Total I/O In: 2421.7 [P.O.:340; I.V.:2081.7] Out: 720 [Urine:720]  General appearance: alert, cooperative and appears stated age Head: Normocephalic, without obvious abnormality, atraumatic Eyes: negative Throat: lips, mucosa, and tongue normal; teeth and gums normal Neck: supple, symmetrical, trachea midline Resp: non-labored on room air Cardio: Nl rate GI: soft, non-tender; bowel sounds normal; no masses,  no organomegaly Male genitalia: normal, foley c/d/i with clear yellow urine Extremities: extremities normal, atraumatic, no cyanosis or edema Pulses: 2+ and symmetric Lymph nodes: Cervical, supraclavicular, and axillary nodes normal. Neurologic: Grossly normal  Lab Results:   Recent Labs  10/05/14 0710 10/05/14 2125 10/06/14 0925  WBC 6.1  --  8.0  HGB 9.4* 9.6* 11.0*  HCT 28.5* 28.6* 32.6*  PLT 153  --  207   BMET  Recent Labs  10/06/14 0925 10/07/14 0537  NA 140 142  K 4.3 4.0  CL 103 105  CO2 24 31  GLUCOSE 163* 114*  BUN 75* 49*  CREATININE 5.02* 3.39*  CALCIUM 9.9 8.9   PT/INR No results for input(s): LABPROT, INR in the last 72 hours. ABG No results for  input(s): PHART, HCO3 in the last 72 hours.  Invalid input(s): PCO2, PO2  Studies/Results: No results found.  Anti-infectives: Anti-infectives    None      Assessment/Plan:  1 - Acute Renal Failure / Urinary Retention - Please continue tamsulosin and finasteride daily at discharge. Pt to follow up with Dr. Risa Grill in about 1 week with trial of void in office. Pt has very good understanding of overall plan and goals.   2 - Will follow prn while in house, please call with questions anytime.   Andrew Coffey, Andrew Coffey, Andrew Coffey, Andrew Coffey 10/08/2014

## 2014-10-23 ENCOUNTER — Other Ambulatory Visit: Payer: Self-pay | Admitting: Urology

## 2014-10-26 ENCOUNTER — Encounter (HOSPITAL_BASED_OUTPATIENT_CLINIC_OR_DEPARTMENT_OTHER): Payer: Self-pay | Admitting: *Deleted

## 2014-10-27 ENCOUNTER — Encounter (HOSPITAL_BASED_OUTPATIENT_CLINIC_OR_DEPARTMENT_OTHER): Payer: Self-pay | Admitting: *Deleted

## 2014-10-27 NOTE — Progress Notes (Signed)
NPO AFTER MN. ARRIVE AT 0700. CURRENT LAB RESULTS IN CHART AND EPIC. WILL TAKE AM MEDS W/ SIPS OF WATER.  PT AWARE OWER AT MAIN.

## 2014-11-01 ENCOUNTER — Ambulatory Visit (HOSPITAL_BASED_OUTPATIENT_CLINIC_OR_DEPARTMENT_OTHER): Payer: BLUE CROSS/BLUE SHIELD | Admitting: Anesthesiology

## 2014-11-01 ENCOUNTER — Encounter (HOSPITAL_COMMUNITY)
Admission: RE | Disposition: A | Discharge status: Home / Self Care | Payer: Self-pay | Source: Ambulatory Visit | Attending: Urology

## 2014-11-01 ENCOUNTER — Observation Stay (HOSPITAL_BASED_OUTPATIENT_CLINIC_OR_DEPARTMENT_OTHER)
Admission: RE | Admit: 2014-11-01 | Discharge: 2014-11-02 | Disposition: A | Discharge status: Home / Self Care | Payer: BLUE CROSS/BLUE SHIELD | Source: Ambulatory Visit | Attending: Urology | Admitting: Urology

## 2014-11-01 ENCOUNTER — Encounter (HOSPITAL_BASED_OUTPATIENT_CLINIC_OR_DEPARTMENT_OTHER): Payer: Self-pay

## 2014-11-01 DIAGNOSIS — R338 Other retention of urine: Secondary | ICD-10-CM | POA: Insufficient documentation

## 2014-11-01 DIAGNOSIS — R3912 Poor urinary stream: Secondary | ICD-10-CM | POA: Diagnosis not present

## 2014-11-01 DIAGNOSIS — R3914 Feeling of incomplete bladder emptying: Secondary | ICD-10-CM | POA: Insufficient documentation

## 2014-11-01 DIAGNOSIS — R350 Frequency of micturition: Secondary | ICD-10-CM | POA: Diagnosis not present

## 2014-11-01 DIAGNOSIS — R339 Retention of urine, unspecified: Secondary | ICD-10-CM | POA: Diagnosis present

## 2014-11-01 DIAGNOSIS — Z79899 Other long term (current) drug therapy: Secondary | ICD-10-CM | POA: Insufficient documentation

## 2014-11-01 DIAGNOSIS — N138 Other obstructive and reflux uropathy: Secondary | ICD-10-CM | POA: Diagnosis not present

## 2014-11-01 DIAGNOSIS — K219 Gastro-esophageal reflux disease without esophagitis: Secondary | ICD-10-CM | POA: Diagnosis not present

## 2014-11-01 DIAGNOSIS — N1330 Unspecified hydronephrosis: Secondary | ICD-10-CM | POA: Diagnosis not present

## 2014-11-01 DIAGNOSIS — N401 Enlarged prostate with lower urinary tract symptoms: Secondary | ICD-10-CM | POA: Diagnosis not present

## 2014-11-01 DIAGNOSIS — N179 Acute kidney failure, unspecified: Secondary | ICD-10-CM | POA: Insufficient documentation

## 2014-11-01 DIAGNOSIS — R351 Nocturia: Secondary | ICD-10-CM | POA: Diagnosis not present

## 2014-11-01 HISTORY — DX: Cyst of kidney, acquired: N28.1

## 2014-11-01 HISTORY — DX: Calculus of kidney: N20.0

## 2014-11-01 HISTORY — DX: Personal history of other diseases of urinary system: Z87.448

## 2014-11-01 HISTORY — DX: Presence of other specified devices: Z97.8

## 2014-11-01 HISTORY — DX: Personal history of urinary calculi: Z87.442

## 2014-11-01 HISTORY — DX: Personal history of other malignant neoplasm of skin: Z85.828

## 2014-11-01 HISTORY — DX: Other specified postprocedural states: Z98.890

## 2014-11-01 HISTORY — DX: Presence of urogenital implants: Z96.0

## 2014-11-01 HISTORY — DX: Presence of spectacles and contact lenses: Z97.3

## 2014-11-01 HISTORY — DX: Retention of urine, unspecified: R33.9

## 2014-11-01 HISTORY — DX: Personal history of colon polyps, unspecified: Z86.0100

## 2014-11-01 HISTORY — DX: Personal history of colonic polyps: Z86.010

## 2014-11-01 HISTORY — PX: TRANSURETHRAL RESECTION OF PROSTATE: SHX73

## 2014-11-01 HISTORY — DX: Benign prostatic hyperplasia without lower urinary tract symptoms: N40.0

## 2014-11-01 HISTORY — DX: Preglaucoma, unspecified, unspecified eye: H40.009

## 2014-11-01 SURGERY — TRANSURETHRAL RESECTION OF THE PROSTATE WITH GYRUS INSTRUMENTS
Anesthesia: General | Site: Prostate

## 2014-11-01 MED ORDER — CIPROFLOXACIN IN D5W 400 MG/200ML IV SOLN
400.0000 mg | Freq: Two times a day (BID) | INTRAVENOUS | Status: AC
  Administered 2014-11-01: 400 mg via INTRAVENOUS
  Filled 2014-11-01: qty 200

## 2014-11-01 MED ORDER — KCL IN DEXTROSE-NACL 20-5-0.9 MEQ/L-%-% IV SOLN
INTRAVENOUS | Status: DC
  Administered 2014-11-01 – 2014-11-02 (×2): via INTRAVENOUS
  Filled 2014-11-01 (×3): qty 1000

## 2014-11-01 MED ORDER — LACTATED RINGERS IV SOLN
INTRAVENOUS | Status: DC
  Administered 2014-11-01: 08:00:00 via INTRAVENOUS
  Filled 2014-11-01: qty 1000

## 2014-11-01 MED ORDER — LIDOCAINE HCL (CARDIAC) 20 MG/ML IV SOLN
INTRAVENOUS | Status: DC | PRN
  Administered 2014-11-01: 100 mg via INTRAVENOUS

## 2014-11-01 MED ORDER — OXYCODONE-ACETAMINOPHEN 5-325 MG PO TABS
1.0000 | ORAL_TABLET | ORAL | Status: DC | PRN
  Filled 2014-11-01: qty 2

## 2014-11-01 MED ORDER — FENTANYL CITRATE (PF) 100 MCG/2ML IJ SOLN
INTRAMUSCULAR | Status: DC | PRN
  Administered 2014-11-01 (×2): 50 ug via INTRAVENOUS

## 2014-11-01 MED ORDER — PHENYLEPHRINE HCL 10 MG/ML IJ SOLN
INTRAMUSCULAR | Status: DC | PRN
  Administered 2014-11-01 (×4): 80 ug via INTRAVENOUS

## 2014-11-01 MED ORDER — CEFTRIAXONE SODIUM IN DEXTROSE 20 MG/ML IV SOLN
1.0000 g | Freq: Once | INTRAVENOUS | Status: AC
  Administered 2014-11-02: 1 g via INTRAVENOUS
  Filled 2014-11-01: qty 50

## 2014-11-01 MED ORDER — PROPOFOL 10 MG/ML IV BOLUS
INTRAVENOUS | Status: DC | PRN
  Administered 2014-11-01: 200 mg via INTRAVENOUS

## 2014-11-01 MED ORDER — LIDOCAINE HCL 2 % EX GEL
CUTANEOUS | Status: DC | PRN
Start: 2014-11-01 — End: 2014-11-01
  Administered 2014-11-01: 1 via URETHRAL

## 2014-11-01 MED ORDER — LATANOPROST 0.005 % OP SOLN
1.0000 [drp] | Freq: Every day | OPHTHALMIC | Status: DC
  Administered 2014-11-01: 1 [drp] via OPHTHALMIC
  Filled 2014-11-01 (×2): qty 2.5

## 2014-11-01 MED ORDER — 0.9 % SODIUM CHLORIDE (POUR BTL) OPTIME
TOPICAL | Status: DC | PRN
  Administered 2014-11-01: 1000 mL

## 2014-11-01 MED ORDER — MEPERIDINE HCL 25 MG/ML IJ SOLN
6.2500 mg | INTRAMUSCULAR | Status: DC | PRN
  Filled 2014-11-01: qty 1

## 2014-11-01 MED ORDER — ACETAMINOPHEN 10 MG/ML IV SOLN
INTRAVENOUS | Status: DC | PRN
  Administered 2014-11-01: 1000 mg via INTRAVENOUS

## 2014-11-01 MED ORDER — CIPROFLOXACIN IN D5W 400 MG/200ML IV SOLN
INTRAVENOUS | Status: AC
  Filled 2014-11-01: qty 200

## 2014-11-01 MED ORDER — TAMSULOSIN HCL 0.4 MG PO CAPS
0.4000 mg | ORAL_CAPSULE | Freq: Every morning | ORAL | Status: DC
  Administered 2014-11-01 – 2014-11-02 (×2): 0.4 mg via ORAL
  Filled 2014-11-01 (×2): qty 1

## 2014-11-01 MED ORDER — ONDANSETRON HCL 4 MG/2ML IJ SOLN
4.0000 mg | INTRAMUSCULAR | Status: DC | PRN
Start: 2014-11-01 — End: 2014-11-02
  Filled 2014-11-01: qty 2

## 2014-11-01 MED ORDER — MORPHINE SULFATE 2 MG/ML IJ SOLN
2.0000 mg | INTRAMUSCULAR | Status: DC | PRN
  Filled 2014-11-01: qty 2

## 2014-11-01 MED ORDER — MIDAZOLAM HCL 2 MG/2ML IJ SOLN
INTRAMUSCULAR | Status: AC
  Filled 2014-11-01: qty 2

## 2014-11-01 MED ORDER — EPHEDRINE SULFATE 50 MG/ML IJ SOLN
INTRAMUSCULAR | Status: DC | PRN
  Administered 2014-11-01 (×2): 10 mg via INTRAVENOUS
  Administered 2014-11-01: 5 mg via INTRAVENOUS

## 2014-11-01 MED ORDER — LACTATED RINGERS IV SOLN
INTRAVENOUS | Status: DC
  Filled 2014-11-01: qty 1000

## 2014-11-01 MED ORDER — CEFTRIAXONE SODIUM IN DEXTROSE 40 MG/ML IV SOLN
2.0000 g | INTRAVENOUS | Status: AC
  Administered 2014-11-01: 2 g via INTRAVENOUS
  Filled 2014-11-01: qty 50

## 2014-11-01 MED ORDER — FENTANYL CITRATE (PF) 100 MCG/2ML IJ SOLN
INTRAMUSCULAR | Status: AC
  Filled 2014-11-01: qty 4

## 2014-11-01 MED ORDER — PROMETHAZINE HCL 25 MG/ML IJ SOLN
6.2500 mg | INTRAMUSCULAR | Status: DC | PRN
  Filled 2014-11-01: qty 1

## 2014-11-01 MED ORDER — MIDAZOLAM HCL 5 MG/5ML IJ SOLN
INTRAMUSCULAR | Status: DC | PRN
  Administered 2014-11-01: 2 mg via INTRAVENOUS

## 2014-11-01 MED ORDER — TAMSULOSIN HCL 0.4 MG PO CAPS
ORAL_CAPSULE | ORAL | Status: AC
  Filled 2014-11-01: qty 1

## 2014-11-01 MED ORDER — DOCUSATE SODIUM 100 MG PO CAPS
100.0000 mg | ORAL_CAPSULE | Freq: Two times a day (BID) | ORAL | Status: DC
  Administered 2014-11-01 – 2014-11-02 (×3): 100 mg via ORAL
  Filled 2014-11-01 (×4): qty 1

## 2014-11-01 MED ORDER — CEFTRIAXONE SODIUM 2 G IJ SOLR
INTRAMUSCULAR | Status: AC
  Filled 2014-11-01: qty 2

## 2014-11-01 MED ORDER — SODIUM CHLORIDE 0.9 % IR SOLN
Status: DC | PRN
  Administered 2014-11-01: 21000 mL via INTRAVESICAL

## 2014-11-01 MED ORDER — FENTANYL CITRATE (PF) 100 MCG/2ML IJ SOLN
25.0000 ug | INTRAMUSCULAR | Status: DC | PRN
  Filled 2014-11-01: qty 1

## 2014-11-01 MED ORDER — ONDANSETRON HCL 4 MG/2ML IJ SOLN
INTRAMUSCULAR | Status: DC | PRN
Start: 2014-11-01 — End: 2014-11-01
  Administered 2014-11-01: 4 mg via INTRAVENOUS

## 2014-11-01 MED ORDER — DEXAMETHASONE SODIUM PHOSPHATE 4 MG/ML IJ SOLN
INTRAMUSCULAR | Status: DC | PRN
  Administered 2014-11-01: 10 mg via INTRAVENOUS

## 2014-11-01 SURGICAL SUPPLY — 34 items
BAG DRAIN URO-CYSTO SKYTR STRL (DRAIN) ×2 IMPLANT
BAG DRN ANRFLXCHMBR STRAP LEK (BAG)
BAG DRN UROCATH (DRAIN) ×1
BAG URINE DRAINAGE (UROLOGICAL SUPPLIES) ×1 IMPLANT
BAG URINE LEG 19OZ MD ST LTX (BAG) IMPLANT
CANISTER SUCT LVC 12 LTR MEDI- (MISCELLANEOUS) ×3 IMPLANT
CATH FOLEY 2WAY SLVR  5CC 20FR (CATHETERS)
CATH FOLEY 2WAY SLVR  5CC 22FR (CATHETERS)
CATH FOLEY 2WAY SLVR 5CC 20FR (CATHETERS) IMPLANT
CATH FOLEY 2WAY SLVR 5CC 22FR (CATHETERS) IMPLANT
CATH FOLEY 3WAY 30CC 22F (CATHETERS) IMPLANT
CATH FOLEY 3WAY 30CC 22FR (CATHETERS) ×1 IMPLANT
CLOTH BEACON ORANGE TIMEOUT ST (SAFETY) ×2 IMPLANT
ELECT BIVAP BIPO 22/24 DONUT (ELECTROSURGICAL)
ELECT BUTTON HF 24-28F 2 30DE (ELECTRODE) ×1 IMPLANT
ELECT REM PT RETURN 9FT ADLT (ELECTROSURGICAL)
ELECTRD BIVAP BIPO 22/24 DONUT (ELECTROSURGICAL) IMPLANT
ELECTRODE REM PT RTRN 9FT ADLT (ELECTROSURGICAL) ×1 IMPLANT
EVACUATOR MICROVAS BLADDER (UROLOGICAL SUPPLIES) IMPLANT
GLOVE BIO SURGEON STRL SZ7.5 (GLOVE) ×2 IMPLANT
GLOVE BIOGEL M 6.5 STRL (GLOVE) ×1 IMPLANT
GLOVE BIOGEL PI IND STRL 6.5 (GLOVE) IMPLANT
GLOVE BIOGEL PI INDICATOR 6.5 (GLOVE) ×2
GOWN STRL REUS W/ TWL XL LVL3 (GOWN DISPOSABLE) ×1 IMPLANT
GOWN STRL REUS W/TWL LRG LVL3 (GOWN DISPOSABLE) ×1 IMPLANT
GOWN STRL REUS W/TWL XL LVL3 (GOWN DISPOSABLE) ×2
HOLDER FOLEY CATH W/STRAP (MISCELLANEOUS) ×1 IMPLANT
IV NS IRRIG 3000ML ARTHROMATIC (IV SOLUTION) ×7 IMPLANT
LOOP CUT BIPOLAR 24F LRG (ELECTROSURGICAL) ×1 IMPLANT
NS IRRIG 1000ML POUR BTL (IV SOLUTION) ×1 IMPLANT
PACK CYSTO (CUSTOM PROCEDURE TRAY) ×2 IMPLANT
PLUG CATH AND CAP STER (CATHETERS) IMPLANT
SET ASPIRATION TUBING (TUBING) ×1 IMPLANT
SYRINGE IRR TOOMEY STRL 70CC (SYRINGE) ×1 IMPLANT

## 2014-11-01 NOTE — Transfer of Care (Signed)
Immediate Anesthesia Transfer of Care Note  Patient: Andrew Coffey  Procedure(s) Performed: Procedure(s): TRANSURETHRAL RESECTION OF THE PROSTATE WITH GYRUS INSTRUMENTS (N/A)  Patient Location: PACU  Anesthesia Type:General  Level of Consciousness: awake, alert  and oriented  Airway & Oxygen Therapy: Patient Spontanous Breathing and Patient connected to nasal cannula oxygen  Post-op Assessment: Report given to RN  Post vital signs: Reviewed and stable  Last Vitals:  Filed Vitals:   11/01/14 0718  BP: 124/79  Pulse: 62  Temp: 36.8 C  Resp: 16    Complications: No apparent anesthesia complications

## 2014-11-01 NOTE — Interval H&P Note (Signed)
History and Physical Interval Note:  11/01/2014 8:32 AM  Andrew Coffey  has presented today for surgery, with the diagnosis of BENIGN PROSTATE HYPERPLASIA, URINARY RETENTION  The various methods of treatment have been discussed with the patient and family. After consideration of risks, benefits and other options for treatment, the patient has consented to  Procedure(s): TRANSURETHRAL RESECTION OF THE PROSTATE WITH GYRUS INSTRUMENTS (N/A) as a surgical intervention .  The patient's history has been reviewed, patient examined, no change in status, stable for surgery.  I have reviewed the patient's chart and labs.  Questions were answered to the patient's satisfaction.     Sovereign Ramiro S

## 2014-11-01 NOTE — Op Note (Signed)
Preoperative diagnosis: BPH with urinary retention Postoperative diagnosis: Same  Procedure: Saline gyrus TURP   Surgeon: Bernestine Amass M.D.  Anesthesia: Gen.  Indications: Andrew Coffey recently developed urinary retention. The etiology for his retention was never completely established but was thought to be secondary to over-the-counter cold and sinus medication. Despite discontinuation of that and initiation of medical therapy was continued to be unable to void. Cystoscopically he had visual obstruction. With his urinary retention was significant acute renal failure with a creatinine of 7.3 and bilateral hydronephrosis. His creatinine has improved to 2.0. He has failed several voiding trials. Urodynamics showed evidence of reduced bladder sensation but a relatively strong and well sustained detrusor contraction but an inability to void. Cystoscopically there was evidence of bladder diverticuli and trabeculation consistent with more long-standing outlet obstruction then had previously been appreciated clinically. We discussed the options for management. We felt he was an excellent candidate for TURP. Risks benefits discussed in detail. Patient presents now for definitive management.     Technique and findings: Patient was brought the operating room where he had successful induction of general anesthesia. He was placed in lithotomy position and prepped and draped in usual manner. He received perioperative antibiotics. Appropriate surgical timeout was performed. With the resectoscope in place prostatic urethra measured a proximally 4 cm. There was some lateral lobe tissue but the most obstructing component was a very high riding median bar along with a small middle lobe. The bladder showed moderate trabecular change with some mild erythema and edema consistent with the catheter. Resection was performed with gyrus instrumentation and a loop electrode. Saline was used as an irrigant. We initiated the  resection at the bladder neck. The median bar and middle lobe were resected down to capsular fibers. That resulted in marked improvement in the visual obstruction. We did elect to resect some lateral lobe tissue bilaterally from the bladder neck out to the apex. Hemostasis remained quite good. The completion of the procedure there was marked improvement in the visual obstruction. No evidence of perforation of the bladder or prostate capsule. All prostate chips were removed and will be sent for permanent pathologic analysis. A 22 French three-way Foley catheter was inserted. Continuous bladder irrigation with saline will be initiated. Urine was very light pink at the completion of the procedure. He was brought to recovery room in stable condition. No obvious complications occurred.

## 2014-11-01 NOTE — Anesthesia Preprocedure Evaluation (Addendum)
Anesthesia Evaluation  Patient identified by MRN, date of birth, ID band Patient awake    Reviewed: Allergy & Precautions, NPO status , Patient's Chart, lab work & pertinent test results  Airway Mallampati: II  TM Distance: >3 FB Neck ROM: Full    Dental no notable dental hx.    Pulmonary neg pulmonary ROS,  breath sounds clear to auscultation  Pulmonary exam normal       Cardiovascular negative cardio ROS  Rhythm:Regular Rate:Normal     Neuro/Psych negative neurological ROS  negative psych ROS   GI/Hepatic Neg liver ROS, GERD-  Controlled,  Endo/Other  negative endocrine ROS  Renal/GU negative Renal ROS  negative genitourinary   Musculoskeletal negative musculoskeletal ROS (+)   Abdominal   Peds negative pediatric ROS (+)  Hematology negative hematology ROS (+)   Anesthesia Other Findings   Reproductive/Obstetrics negative OB ROS                             Anesthesia Physical Anesthesia Plan  ASA: II  Anesthesia Plan: General   Post-op Pain Management:    Induction: Intravenous  Airway Management Planned: LMA  Additional Equipment:   Intra-op Plan:   Post-operative Plan: Extubation in OR  Informed Consent: I have reviewed the patients History and Physical, chart, labs and discussed the procedure including the risks, benefits and alternatives for the proposed anesthesia with the patient or authorized representative who has indicated his/her understanding and acceptance.   Dental advisory given  Plan Discussed with: CRNA  Anesthesia Plan Comments:         Anesthesia Quick Evaluation

## 2014-11-01 NOTE — Anesthesia Procedure Notes (Signed)
Procedure Name: LMA Insertion Date/Time: 11/01/2014 8:40 AM Performed by: Bethena Roys T Pre-anesthesia Checklist: Patient identified, Emergency Drugs available, Suction available and Patient being monitored Patient Re-evaluated:Patient Re-evaluated prior to inductionOxygen Delivery Method: Circle System Utilized Preoxygenation: Pre-oxygenation with 100% oxygen Intubation Type: IV induction Ventilation: Mask ventilation without difficulty LMA: LMA inserted LMA Size: 5.0 Number of attempts: 1 Airway Equipment and Method: Bite block Placement Confirmation: positive ETCO2 Dental Injury: Teeth and Oropharynx as per pre-operative assessment

## 2014-11-01 NOTE — H&P (Signed)
History of Present Illness   [name] returns today to discuss his recent video urodynamics as well as to undergo flexible cystoscopy and repeat voiding trial. I last saw him back in January of 2016 for routine annual followup. At that time, things were fairly stable. As a baseline, he is not really noticed any marked decline in his urinary stream and he typically had nocturia of 1-2 times per evening. He had no sense of incomplete emptying. In mid February of this year, he developed an upper respiratory illness and began taking some Advil Cold and Sinus for approximately a week. By the end of that time frame, he noticed that he was voiding more frequently smaller amounts with a weaker stream. He was initially seen by his primary care provider and was put on an antibiotic for his upper respiratory illness. He initially improved, but then had ongoing upper respiratory symptoms. His voiding situation continued to worsen. He began experiencing nocturia 5-8 times per evening. He eventually was back to see his primary care provider near the end of March. He also began describing a metallic taste in his mouth.    He was noted at that time to have acute renal failure with a creatinine of 7.3. Ultrasound showed bilateral hydronephrosis and his renal failure was thought to be obstructive in etiology. 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 Foley catheter was placed. He was seen by Dr Matilde Sprang in our office as a consultation. The patient is subsequently followed up here by Diane. At the time of discharge, his creatinine was down to 2.35. He was started on finasteride along with tamsulosin, for which he is taking now for at least several days. A followup creatinine in our office from October 11, 2014 had shown further improvement down to 2.0.     The patient underwent a voiding trial in our office approximately one week ago. He had somewhat diminished bladder sensation. He is  unable to void and had return to our office later that day for reinsertion of his Foley catheter. Approximately 1000 cc of urine was obtained at that time and he had very little in the way of sensation suggesting that he has had some probable long-standing silent prostatism.      We recommended urodynamics to try to get a better handle on bladder function. First sensation did not occur until it proximally 400 mL. He did have some mild bladder instability with maximum unstable bladder contraction pressures of 20 cm of water pressure without urine leakage. The patient was able to generate a detrusor contraction but was unable to void. Maximum detrusor pressure was 82 cm of water pressure and the contraction appeared to be well sustained.   Past Medical History Problems  1. History of heartburn (U27.253)  Surgical History Problems  1. History of Cataract Surgery 2. History of Eye Surgery 3. History of Tonsillectomy  Current Meds 1. Eye Drops SOLN;  Therapy: (Recorded:18Jan2016) to Recorded 2. Finasteride TABS;  Therapy: (Recorded:30Mar2016) to Recorded 3. Flomax CAPS;  Therapy: (Recorded:30Mar2016) to Recorded 4. Midrin CAPS; 1 as needed;  Therapy: (Recorded:31May2012) to Recorded 5. Multi-Vitamin TABS; TAKE 1 TABLET DAILY;  Therapy: (Recorded:31May2012) to Recorded  Allergies Medication  1. No Known Drug Allergies  Family History Problems  1. Family history of Acute Myocardial Infarction : Father 2. Family history of Family Health Status Number Of Children   2 daughters 3. Family history of Family Health Status Of Father - Deceased : Paternal Grandfather   82yrs,  MI 4. Family history of Family Health Status Of Mother - Deceased : Paternal Grandfather   32yrs, ITP 5. Family history of Tuberculosis : Paternal Grandfather  Social History Problems  1. Alcohol Use   2qd 2. Caffeine Use   3qd 3. Marital History - Currently Married 4. Never A Smoker 5. Occupation:    attorney  Review of Systems Genitourinary, constitutional, skin, eye, otolaryngeal, hematologic/lymphatic, cardiovascular, pulmonary, endocrine, musculoskeletal, gastrointestinal, neurological and psychiatric system(s) were reviewed and pertinent findings if present are noted and are otherwise negative.  Genitourinary: urinary frequency, nocturia, difficulty starting the urinary stream, weak urinary stream, urinary stream starts and stops and incomplete emptying of bladder, but no hematuria.    Physical Exam Constitutional: Well nourished and well developed . No acute distress.  Neck: The appearance of the neck is normal and no neck mass is present.  Pulmonary: No respiratory distress and normal respiratory rhythm and effort.  Abdomen: The abdomen is soft and nontender. No masses are palpated. No CVA tenderness. No hernias are palpable. No hepatosplenomegaly noted.  Rectal: Rectal exam demonstrates normal sphincter tone, no tenderness and no masses. Estimated prostate size is 1+. Normal rectal tone, no rectal masses, prostate is smooth, symmetric and non-tender. The prostate has no nodularity and is not tender. The left seminal vesicle is nonpalpable. The right seminal vesicle is nonpalpable. The perineum is normal on inspection.  Genitourinary: Examination of the penis demonstrates an indwelling catheter, but no discharge, no masses, no lesions and a normal meatus. The penis is circumcised. The scrotum is without lesions. The right epididymis is palpably normal and non-tender. The left epididymis is palpably normal and non-tender. The right testis is non-tender and without masses. The left testis is non-tender and without masses.    Procedure  Procedure: Cystoscopy   Indication: Lower Urinary Tract Symptoms.  Informed Consent: Risks, benefits, and potential adverse events were discussed and informed consent was obtained from the patient . Specific risks including, but not limited to bleeding,  infection, pain, allergic reaction etc. were explained.  Anesthesia:. Local anesthesia was administered intraurethrally with 2% lidocaine jelly.  Procedure Note:  Urethral meatus:. No abnormalities.  Anterior urethra: No abnormalities.  Prostatic urethra:. Estimated length was 4 cm. The lateral and median prostatic lobes were enlarged. An enlarged intravesical median lobe was visualized.  Bladder: Visulization was clear. A systematic survey of the bladder demonstrated no bladder tumors or stones. Examination of the bladder demonstrated trabeculation erythematous mucosa, edema and cellules.  Complications: None.    Assessment Assessed  1. Urinary retention (R33.9) 2. Hydronephrosis, bilateral (N13.30) 3. Enlarged prostate without lower urinary tract symptoms (luts) (N40.0)  Plan Enlarged prostate without lower urinary tract symptoms (luts), Urinary retention  1. Follow-up Schedule Surgery Office  Follow-up  Status: Hold For - Appointment   Requested for: 11Apr2016  Discussion/Summary Mister Passe was unable to void once again status post a cystoscopy. I believe these had probable long-standing prostatism which now has resulted in urinary retention. There is no evidence that he has had an acute prostatitis. We thought initially that some of this was related to over-the-counter medication but he has been unable to void now despite maximal medical therapy. It is certainly possible that given several additional weeks that he might be able to void spontaneously. I do think given his bladder trabeculation and diverticuli that it probably likely that he has had some fairly long-standing outlet obstruction that he was unaware of. His bladder sensation is clearly diminished. I was very encouraged  that he had a relatively strong and well sustained detrusor contraction at the time of urodynamics and that certainly increases the odds that he will void successfully status post a TURP. I do think at this  point he has a good candidate for a gyrus saline TURP. That procedure was discussed with him in detail. We talked about the recovery. We talked about the chances of success as well as the potential complications. We have replaced his Foley catheter today and he has elected to proceed with TURP.    CC Mertha Finders, M.D.   Signatures Electronically signed by : Rana Snare, M.D.; Oct 23 2014  9:22AM EST

## 2014-11-01 NOTE — Anesthesia Postprocedure Evaluation (Signed)
  Anesthesia Post-op Note  Patient: Andrew Coffey  Procedure(s) Performed: Procedure(s) (LRB): TRANSURETHRAL RESECTION OF THE PROSTATE WITH GYRUS INSTRUMENTS (N/A)  Patient Location: PACU  Anesthesia Type: General  Level of Consciousness: awake and alert   Airway and Oxygen Therapy: Patient Spontanous Breathing  Post-op Pain: mild  Post-op Assessment: Post-op Vital signs reviewed, Patient's Cardiovascular Status Stable, Respiratory Function Stable, Patent Airway and No signs of Nausea or vomiting  Last Vitals:  Filed Vitals:   11/01/14 1015  BP: 127/74  Pulse: 70  Temp:   Resp: 12    Post-op Vital Signs: stable   Complications: No apparent anesthesia complications

## 2014-11-02 ENCOUNTER — Encounter (HOSPITAL_BASED_OUTPATIENT_CLINIC_OR_DEPARTMENT_OTHER): Payer: Self-pay | Admitting: Urology

## 2014-11-02 DIAGNOSIS — N401 Enlarged prostate with lower urinary tract symptoms: Secondary | ICD-10-CM | POA: Diagnosis not present

## 2014-11-02 LAB — BASIC METABOLIC PANEL
Anion gap: 5 (ref 5–15)
BUN: 16 mg/dL (ref 6–23)
CALCIUM: 8.5 mg/dL (ref 8.4–10.5)
CO2: 27 mmol/L (ref 19–32)
Chloride: 108 mmol/L (ref 96–112)
Creatinine, Ser: 1.34 mg/dL (ref 0.50–1.35)
GFR calc Af Amer: 64 mL/min — ABNORMAL LOW (ref >90)
GFR, EST NON AFRICAN AMERICAN: 55 mL/min — AB (ref >90)
GLUCOSE: 120 mg/dL — AB (ref 70–99)
Potassium: 3.5 mmol/L (ref 3.5–5.1)
Sodium: 140 mmol/L (ref 135–145)

## 2014-11-02 LAB — HEMOGLOBIN AND HEMATOCRIT, BLOOD
HEMATOCRIT: 26.6 % — AB (ref 39.0–52.0)
Hemoglobin: 9 g/dL — ABNORMAL LOW (ref 13.0–17.0)

## 2014-11-02 MED ORDER — OXYCODONE-ACETAMINOPHEN 5-325 MG PO TABS: 1.0000 | ORAL_TABLET | ORAL | Status: DC | PRN

## 2014-11-02 MED ORDER — SULFAMETHOXAZOLE-TRIMETHOPRIM 800-160 MG PO TABS: 1.0000 | ORAL_TABLET | Freq: Two times a day (BID) | ORAL | Status: DC

## 2014-11-02 NOTE — Discharge Summary (Signed)
Patient ID: Andrew Coffey MRN: [id number] DOB/AGE: 64-22-1952 64 y.o.  Admit date: 11/01/2014 Discharge date: 11/02/2014    Discharge Diagnoses:   Present on Admission:  **None**  Consults:  None    Discharge Medications:   Medication List    TAKE these medications        finasteride 5 MG tablet  Commonly known as:  PROSCAR  Take 1 tablet (5 mg total) by mouth daily.     isometheptene-acetaminophen-dichloralphenazone 65-325-100 MG capsule  Commonly known as:  MIDRIN  Take 1-2 capsules by mouth 4 (four) times daily as needed. For Migraines     latanoprost 0.005 % ophthalmic solution  Commonly known as:  XALATAN  Place 1 drop into both eyes at bedtime.     oxyCODONE-acetaminophen 5-325 MG per tablet  Commonly known as:  PERCOCET/ROXICET  Take 1-2 tablets by mouth every 4 (four) hours as needed for moderate pain.     sulfamethoxazole-trimethoprim 800-160 MG per tablet  Commonly known as:  BACTRIM DS,SEPTRA DS  Take 1 tablet by mouth 2 (two) times daily.     SYSTANE 0.4-0.3 % Soln  Generic drug:  Polyethyl Glycol-Propyl Glycol  Place 1 drop into both eyes 2 (two) times daily as needed. For dry eyes     tamsulosin 0.4 MG Caps capsule  Commonly known as:  FLOMAX  Take 1 capsule (0.4 mg total) by mouth daily.         Significant Diagnostic Studies:  No results found.    Hospital Course:  Active Problems:   BPH with urinary obstruction  Patient developed urinary retention. Urodynamics confirmed obstruction. Patient underwent a TURP and was kept overnight for observation/supportive care. He did well. He had no complications or problems. Blood work was acceptable in the morning. Urine was very light pink color. He will be discharged today with plans to be seen in our office tomorrow for a voiding trial. Day of Discharge BP 120/64 mmHg  Pulse 63  Temp(Src) 98.3 F (36.8 C) (Oral)  Resp 16  Ht 5\' 10"  (1.778 m)  Wt 79.2 kg (174 lb 9.7 oz)  BMI 25.05 kg/m2   SpO2 98% Well-developed well-nourished male in no acute distress oriented 3 and alert. Respiratory effort normal Abdomen benign Extremities without asymmetrical edema Results for orders placed or performed during the hospital encounter of 11/01/14 (from the past 24 hour(s))  Basic metabolic panel     Status: Abnormal   Collection Time: 11/02/14  5:14 AM  Result Value Ref Range   Sodium 140 135 - 145 mmol/L   Potassium 3.5 3.5 - 5.1 mmol/L   Chloride 108 96 - 112 mmol/L   CO2 27 19 - 32 mmol/L   Glucose, Bld 120 (H) 70 - 99 mg/dL   BUN 16 6 - 23 mg/dL   Creatinine, Ser 1.34 0.50 - 1.35 mg/dL   Calcium 8.5 8.4 - 10.5 mg/dL   GFR calc non Af Amer 55 (L) >90 mL/min   GFR calc Af Amer 64 (L) >90 mL/min   Anion gap 5 5 - 15  Hemoglobin and hematocrit, blood     Status: Abnormal   Collection Time: 11/02/14  5:14 AM  Result Value Ref Range   Hemoglobin 9.0 (L) 13.0 - 17.0 g/dL   HCT 26.6 (L) 39.0 - 52.0 %

## 2014-11-02 NOTE — Progress Notes (Signed)
UR completed 

## 2014-11-02 NOTE — Discharge Instructions (Signed)

## 2015-11-09 DIAGNOSIS — L601 Onycholysis: Secondary | ICD-10-CM | POA: Diagnosis not present

## 2015-11-09 DIAGNOSIS — B351 Tinea unguium: Secondary | ICD-10-CM | POA: Diagnosis not present

## 2015-11-09 DIAGNOSIS — Z79899 Other long term (current) drug therapy: Secondary | ICD-10-CM | POA: Diagnosis not present

## 2015-11-10 IMAGING — US US RENAL
1 series · 13 of 25 positions shown · non-contrast
Comparison: CT 12/13/2010

CLINICAL DATA: Acute renal failure. Weakness and shortness of
breath. History of renal stones.

EXAM:
RENAL/URINARY TRACT ULTRASOUND COMPLETE

[Series 1: us renal · 0.22mm/px · 75 acquisitions, 13 frames shown]
[im 1/75]
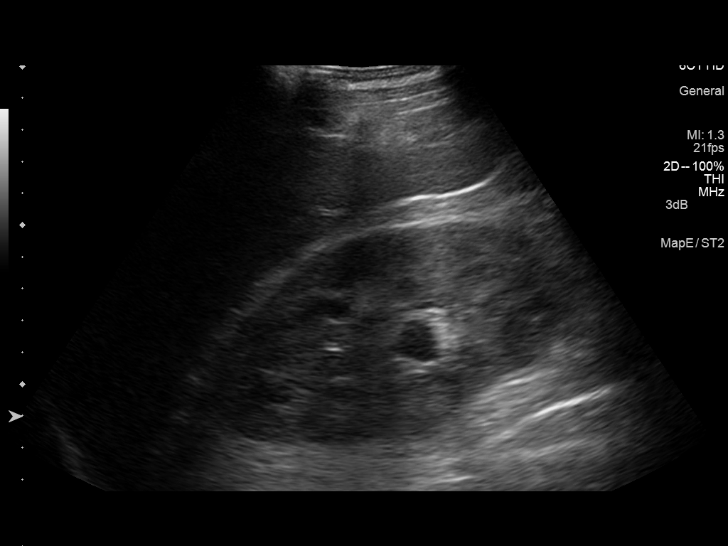
[im 7/75]
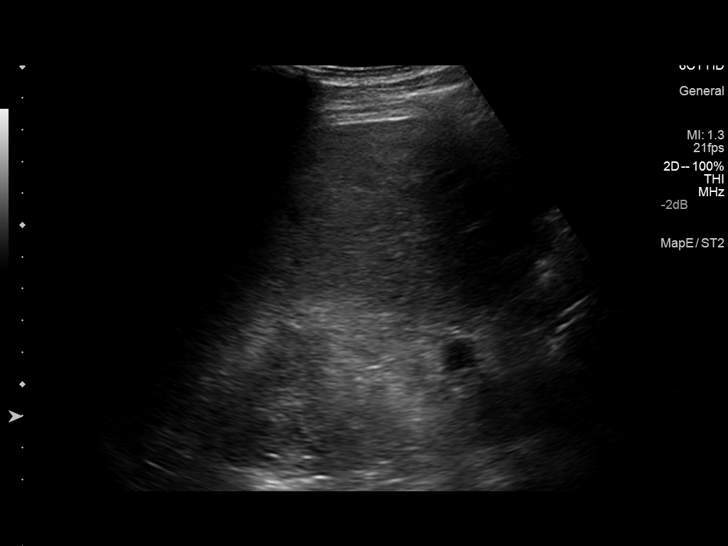
[im 13/75]
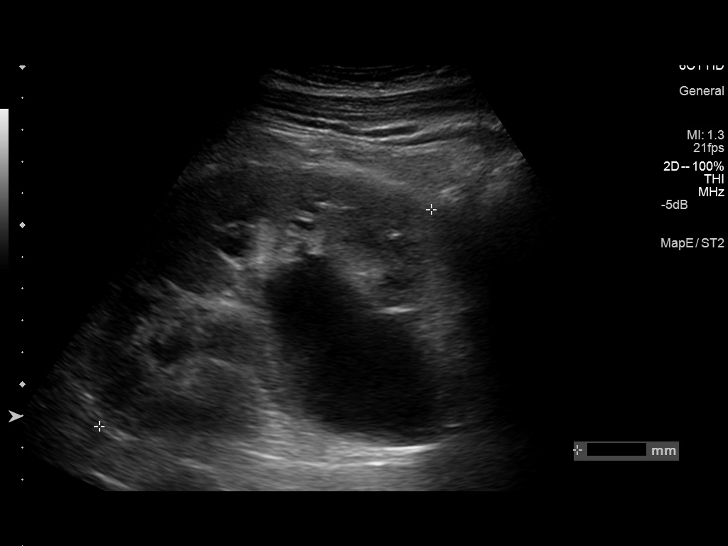
[im 19/75]
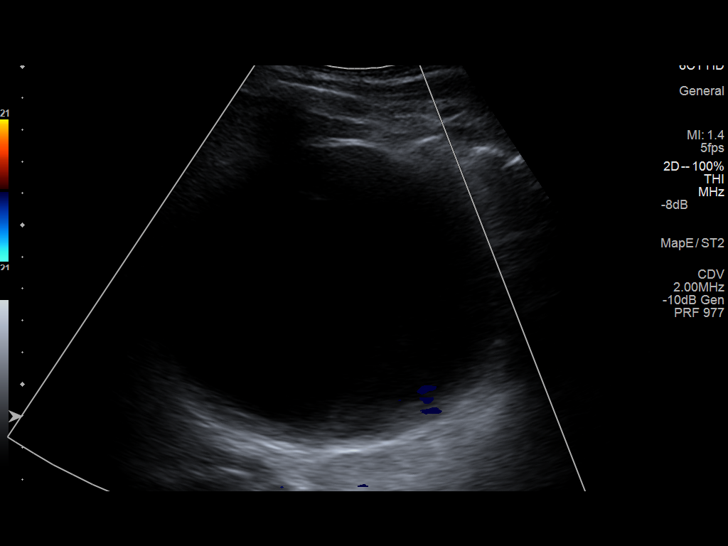
[im 25/75]
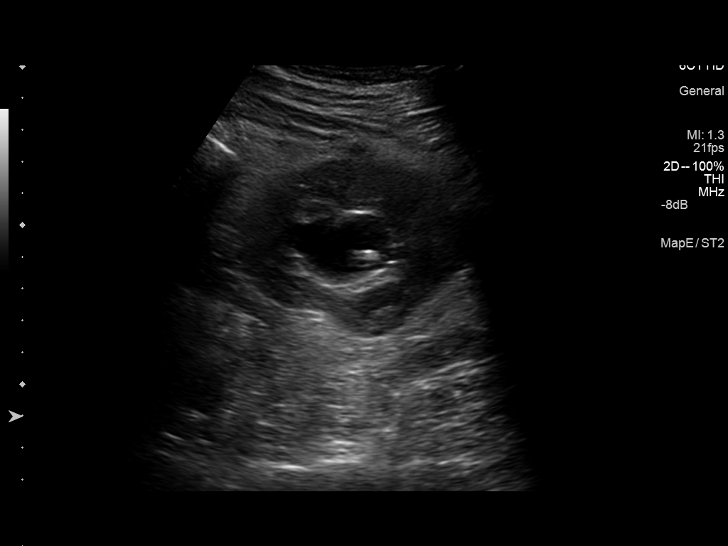
[im 31/75]
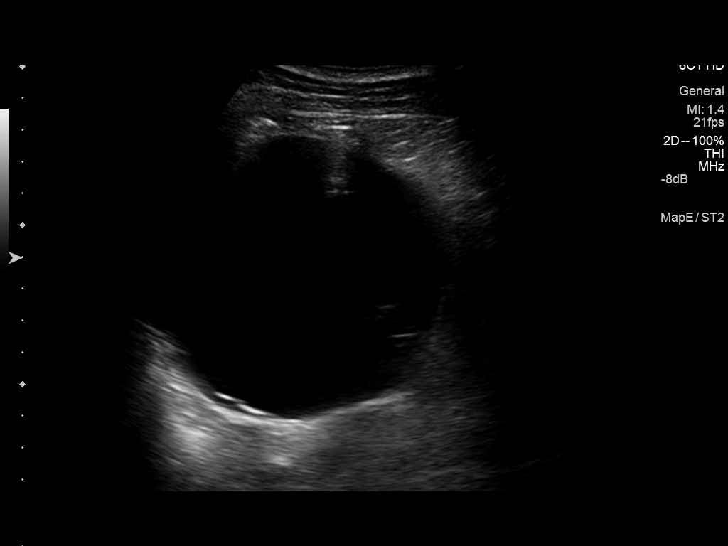
[im 38/75]
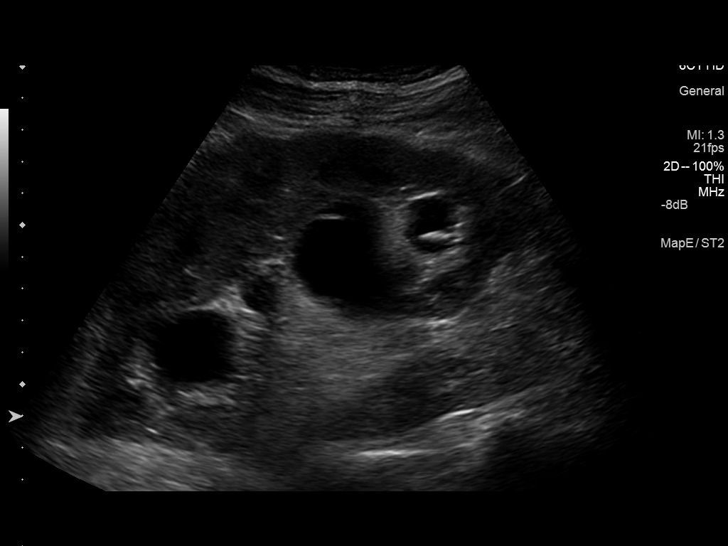
[im 44/75]
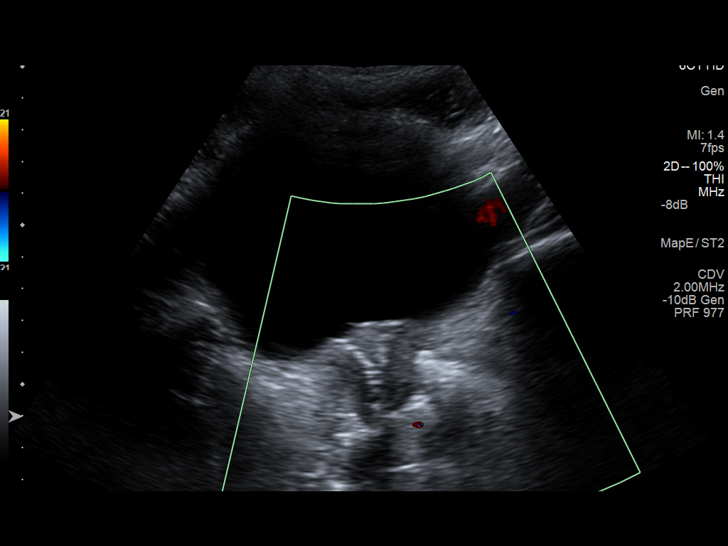
[im 50/75]
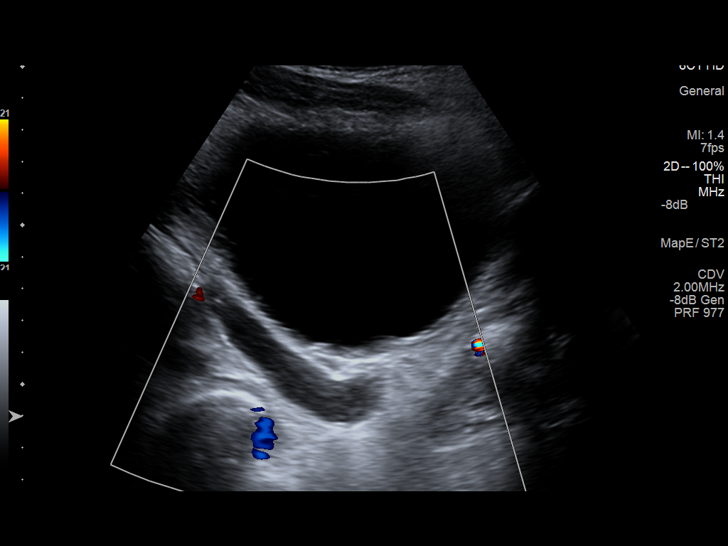
[im 56/75]
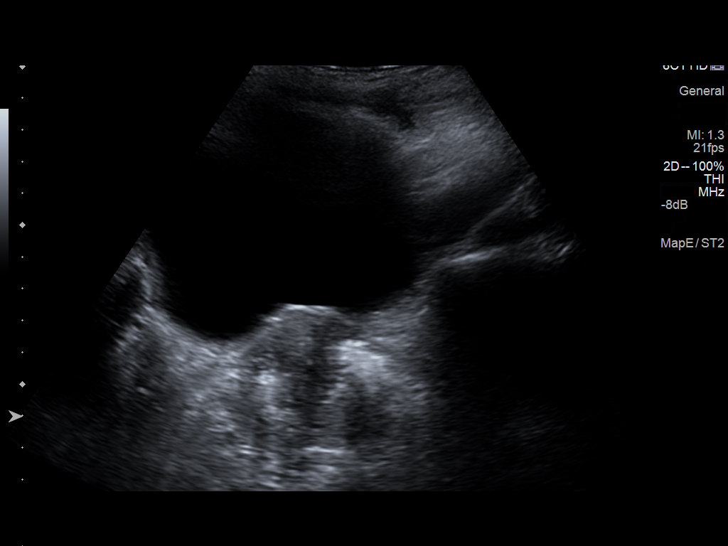
[im 62/75]
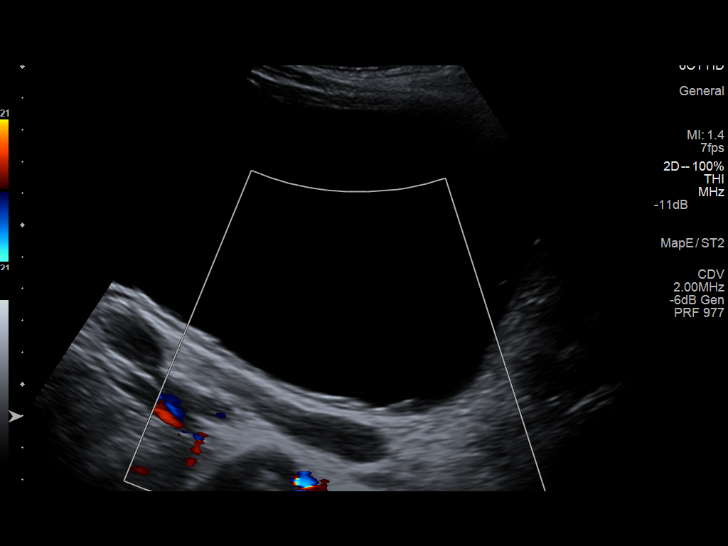
[im 68/75]
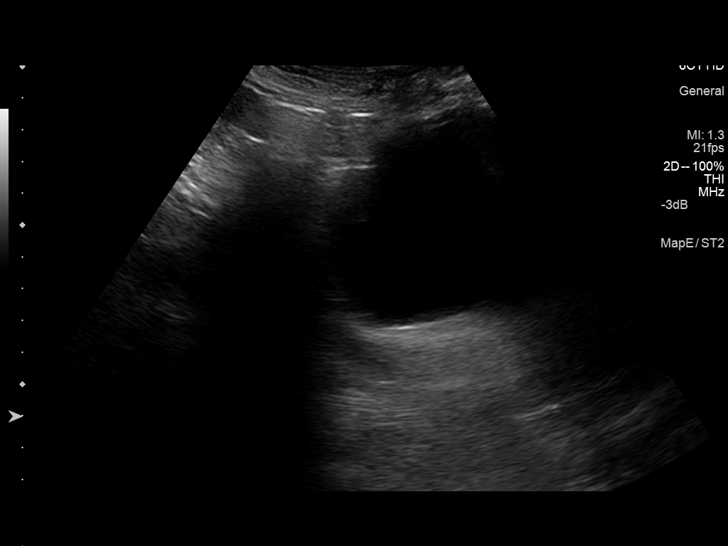
[im 75/75]
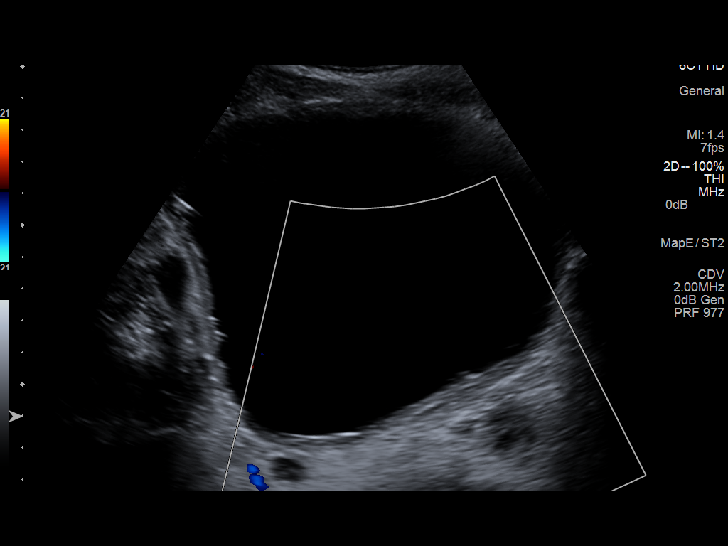

[13 of 25 positions shown; findings below may reference images not displayed]

FINDINGS: Right Kidney:

Length: Enlarged measuring 14.2 cm. There is moderate
hydronephrosis. The renal pelvis measures 4.6 cm proximally. No
definite shadowing stones.

Left Kidney:

Length: Enlarged measuring 16.3 cm. There is moderate
hydronephrosis. The renal pelvis measures 4.2 cm, proximal ureter
measures 1.7 cm. Large cyst in the mid upper kidney measures 10.1 x
11.7 x 10.1 cm. There is a 1.0 x 0.9 cm echogenic structure
consistent with stone in the lower kidney.

Bladder:

Distended, bladder volume of 1,111 cc. Question of right-sided
diverticulum measuring 1.5 x 1.5 x 1.8 cm. The prostate gland is
enlarged measuring 6.2 x 6.7 by 5.9 cm with irregular borders,
calcifications, and mass effect on the bladder base. The left distal
ureter is seen, dilated in its course measuring 1.4-1.6 cm. The
right distal ureter is dilated measuring 1.7 cm.
IMPRESSION: 1. Distended urinary bladder with questionable bladder diverticulum.
There is moderate bilateral hydroureteronephrosis. Nonobstructing
stone in the left kidney.
2. Enlarged irregular prostate gland with calcifications. Findings
suggest bladder outlet obstruction. Correlation with PSA
recommended.
3. Large left renal cyst.

## 2016-03-03 DIAGNOSIS — H2512 Age-related nuclear cataract, left eye: Secondary | ICD-10-CM | POA: Diagnosis not present

## 2016-03-03 DIAGNOSIS — H401111 Primary open-angle glaucoma, right eye, mild stage: Secondary | ICD-10-CM | POA: Diagnosis not present

## 2016-03-03 DIAGNOSIS — H52203 Unspecified astigmatism, bilateral: Secondary | ICD-10-CM | POA: Diagnosis not present

## 2016-03-03 DIAGNOSIS — H401122 Primary open-angle glaucoma, left eye, moderate stage: Secondary | ICD-10-CM | POA: Diagnosis not present

## 2016-03-13 DIAGNOSIS — H52222 Regular astigmatism, left eye: Secondary | ICD-10-CM | POA: Diagnosis not present

## 2016-03-13 DIAGNOSIS — H25812 Combined forms of age-related cataract, left eye: Secondary | ICD-10-CM | POA: Diagnosis not present

## 2016-03-13 DIAGNOSIS — H2512 Age-related nuclear cataract, left eye: Secondary | ICD-10-CM | POA: Diagnosis not present

## 2016-03-13 DIAGNOSIS — H25012 Cortical age-related cataract, left eye: Secondary | ICD-10-CM | POA: Diagnosis not present

## 2016-04-09 DIAGNOSIS — Z23 Encounter for immunization: Secondary | ICD-10-CM | POA: Diagnosis not present

## 2016-04-15 DIAGNOSIS — Z713 Dietary counseling and surveillance: Secondary | ICD-10-CM | POA: Diagnosis not present

## 2016-04-29 DIAGNOSIS — Z713 Dietary counseling and surveillance: Secondary | ICD-10-CM | POA: Diagnosis not present

## 2016-04-30 DIAGNOSIS — J019 Acute sinusitis, unspecified: Secondary | ICD-10-CM | POA: Diagnosis not present

## 2016-05-27 DIAGNOSIS — Z713 Dietary counseling and surveillance: Secondary | ICD-10-CM | POA: Diagnosis not present

## 2016-06-02 DIAGNOSIS — N400 Enlarged prostate without lower urinary tract symptoms: Secondary | ICD-10-CM | POA: Diagnosis not present

## 2016-06-02 DIAGNOSIS — R3129 Other microscopic hematuria: Secondary | ICD-10-CM | POA: Diagnosis not present

## 2016-06-16 DIAGNOSIS — Z713 Dietary counseling and surveillance: Secondary | ICD-10-CM | POA: Diagnosis not present

## 2016-07-17 DIAGNOSIS — J019 Acute sinusitis, unspecified: Secondary | ICD-10-CM | POA: Diagnosis not present

## 2016-07-22 DIAGNOSIS — Z713 Dietary counseling and surveillance: Secondary | ICD-10-CM | POA: Diagnosis not present

## 2016-08-04 DIAGNOSIS — B351 Tinea unguium: Secondary | ICD-10-CM | POA: Diagnosis not present

## 2016-08-04 DIAGNOSIS — L821 Other seborrheic keratosis: Secondary | ICD-10-CM | POA: Diagnosis not present

## 2016-08-11 DIAGNOSIS — Z713 Dietary counseling and surveillance: Secondary | ICD-10-CM | POA: Diagnosis not present

## 2016-08-14 DIAGNOSIS — Z0000 Encounter for general adult medical examination without abnormal findings: Secondary | ICD-10-CM | POA: Diagnosis not present

## 2016-08-14 DIAGNOSIS — E559 Vitamin D deficiency, unspecified: Secondary | ICD-10-CM | POA: Diagnosis not present

## 2016-08-14 DIAGNOSIS — H40059 Ocular hypertension, unspecified eye: Secondary | ICD-10-CM | POA: Diagnosis not present

## 2016-08-14 DIAGNOSIS — Z23 Encounter for immunization: Secondary | ICD-10-CM | POA: Diagnosis not present

## 2016-08-14 DIAGNOSIS — R311 Benign essential microscopic hematuria: Secondary | ICD-10-CM | POA: Diagnosis not present

## 2016-08-14 DIAGNOSIS — D509 Iron deficiency anemia, unspecified: Secondary | ICD-10-CM | POA: Diagnosis not present

## 2016-08-20 DIAGNOSIS — H401122 Primary open-angle glaucoma, left eye, moderate stage: Secondary | ICD-10-CM | POA: Diagnosis not present

## 2016-08-20 DIAGNOSIS — H401111 Primary open-angle glaucoma, right eye, mild stage: Secondary | ICD-10-CM | POA: Diagnosis not present

## 2016-11-03 DIAGNOSIS — B351 Tinea unguium: Secondary | ICD-10-CM | POA: Diagnosis not present

## 2016-11-03 DIAGNOSIS — Z79899 Other long term (current) drug therapy: Secondary | ICD-10-CM | POA: Diagnosis not present

## 2016-11-05 DIAGNOSIS — R0981 Nasal congestion: Secondary | ICD-10-CM | POA: Diagnosis not present

## 2016-11-05 DIAGNOSIS — J309 Allergic rhinitis, unspecified: Secondary | ICD-10-CM | POA: Diagnosis not present

## 2016-12-23 DIAGNOSIS — H1859 Other hereditary corneal dystrophies: Secondary | ICD-10-CM | POA: Diagnosis not present

## 2016-12-23 DIAGNOSIS — H52201 Unspecified astigmatism, right eye: Secondary | ICD-10-CM | POA: Diagnosis not present

## 2016-12-23 DIAGNOSIS — H401122 Primary open-angle glaucoma, left eye, moderate stage: Secondary | ICD-10-CM | POA: Diagnosis not present

## 2016-12-23 DIAGNOSIS — H401111 Primary open-angle glaucoma, right eye, mild stage: Secondary | ICD-10-CM | POA: Diagnosis not present

## 2017-01-07 DIAGNOSIS — B356 Tinea cruris: Secondary | ICD-10-CM | POA: Diagnosis not present

## 2017-01-07 DIAGNOSIS — Z85828 Personal history of other malignant neoplasm of skin: Secondary | ICD-10-CM | POA: Diagnosis not present

## 2017-01-07 DIAGNOSIS — L821 Other seborrheic keratosis: Secondary | ICD-10-CM | POA: Diagnosis not present

## 2017-01-07 DIAGNOSIS — L570 Actinic keratosis: Secondary | ICD-10-CM | POA: Diagnosis not present

## 2017-01-07 DIAGNOSIS — D1801 Hemangioma of skin and subcutaneous tissue: Secondary | ICD-10-CM | POA: Diagnosis not present

## 2017-03-11 DIAGNOSIS — Z713 Dietary counseling and surveillance: Secondary | ICD-10-CM | POA: Diagnosis not present

## 2017-03-25 DIAGNOSIS — Z713 Dietary counseling and surveillance: Secondary | ICD-10-CM | POA: Diagnosis not present

## 2017-03-30 DIAGNOSIS — H35352 Cystoid macular degeneration, left eye: Secondary | ICD-10-CM | POA: Diagnosis not present

## 2017-03-30 DIAGNOSIS — H401122 Primary open-angle glaucoma, left eye, moderate stage: Secondary | ICD-10-CM | POA: Diagnosis not present

## 2017-04-08 DIAGNOSIS — Z713 Dietary counseling and surveillance: Secondary | ICD-10-CM | POA: Diagnosis not present

## 2017-04-10 DIAGNOSIS — Z23 Encounter for immunization: Secondary | ICD-10-CM | POA: Diagnosis not present

## 2017-05-06 DIAGNOSIS — Z713 Dietary counseling and surveillance: Secondary | ICD-10-CM | POA: Diagnosis not present

## 2017-05-20 DIAGNOSIS — Z713 Dietary counseling and surveillance: Secondary | ICD-10-CM | POA: Diagnosis not present

## 2017-05-26 DIAGNOSIS — N400 Enlarged prostate without lower urinary tract symptoms: Secondary | ICD-10-CM | POA: Diagnosis not present

## 2017-05-26 DIAGNOSIS — N5201 Erectile dysfunction due to arterial insufficiency: Secondary | ICD-10-CM | POA: Diagnosis not present

## 2017-05-28 ENCOUNTER — Encounter: Payer: Self-pay | Admitting: Podiatry

## 2017-05-28 ENCOUNTER — Ambulatory Visit (INDEPENDENT_AMBULATORY_CARE_PROVIDER_SITE_OTHER): Payer: BLUE CROSS/BLUE SHIELD | Admitting: Podiatry

## 2017-05-28 VITALS — BP 113/68 | HR 90 | Resp 16

## 2017-05-28 DIAGNOSIS — M79676 Pain in unspecified toe(s): Secondary | ICD-10-CM

## 2017-05-28 DIAGNOSIS — B351 Tinea unguium: Secondary | ICD-10-CM

## 2017-05-28 NOTE — Progress Notes (Signed)
   Subjective:    Patient ID: Andrew Coffey, male    DOB: January 31, 1951, 66 y.o.   MRN: [id number]  HPI    Review of Systems  All other systems reviewed and are negative.      Objective:   Physical Exam        Assessment & Plan:

## 2017-05-29 ENCOUNTER — Telehealth: Payer: Self-pay | Admitting: Podiatry

## 2017-05-29 NOTE — Telephone Encounter (Signed)
I saw Dr. Paulla Dolly yesterday and he was supposed to prescribe a medication for me. I think I gave inaccurate pharmacy information. The correct pharmacy is [facility] at [address]. Please call me back in regards to the prescription.

## 2017-05-29 NOTE — Progress Notes (Signed)
Subjective:    Patient ID: Andrew Coffey, male   DOB: 66 y.o.   MRN: [id number]   HPI patient presents and poor health with elongated nailbeds 1-5 both feet that are thick yellow brittle and moderately painful. Patient also a currently does not smoke and has trouble wearing shoe gear due to pain    Review of Systems  All other systems reviewed and are negative.       Objective:  Physical Exam  Constitutional: He appears well-developed and well-nourished.  Cardiovascular: Intact distal pulses.  Musculoskeletal: Normal range of motion.  Neurological: He is alert.  Skin: Skin is warm and dry.  Nursing note and vitals reviewed.  neurovascular status was found to be intact with muscle strength mildly diminished. Patient was noted to have equinus condition and has thick nailbeds 1-5 both feet that are moderately tender when pressed and incurvated in the corners. Patient has good digital perfusion well oriented 3     Assessment:   Mycotic nail infection with pain 1-5 both feet      Plan:    Debride painful nailbeds 1-5 both feet with no iatrogenic bleeding noted

## 2017-06-01 ENCOUNTER — Telehealth: Payer: Self-pay | Admitting: *Deleted

## 2017-06-01 NOTE — Telephone Encounter (Signed)
Returned patient's phone call to [phone] (Home #) and left a voicemail message. Pt had called back last week regarding medication that Dr. Paulla Dolly was supposed to send over. When I looked in the chart, nothing was sent over for nail fungus and there is no documentation for him to receive medication.  I did correct his pharmacy to reflect [facility] [address] in [place], [place].  Waiting for a response.

## 2017-06-02 NOTE — Telephone Encounter (Signed)
Hello. I had a message from the nurse last night saying that Dr. Paulla Dolly did not have a prescription to call in for me. I think there has been some mistake. He said he was going to give me a prescription for a pill to take for 7 days in a row once a month. Could the nurse check with him about that and if that is correct, could you please get that called in to the [facility] at the corner of [address] and [address]. My number is [phone]. Thank you.

## 2017-06-03 ENCOUNTER — Other Ambulatory Visit: Payer: Self-pay

## 2017-06-03 MED ORDER — TERBINAFINE HCL 250 MG PO TABS: ORAL_TABLET | ORAL | 0 refills | Status: DC

## 2017-06-03 NOTE — Telephone Encounter (Signed)
Medication sent to pharmacy, lamisil pulse pack

## 2017-06-08 DIAGNOSIS — J329 Chronic sinusitis, unspecified: Secondary | ICD-10-CM | POA: Diagnosis not present

## 2017-08-18 DIAGNOSIS — Z125 Encounter for screening for malignant neoplasm of prostate: Secondary | ICD-10-CM | POA: Diagnosis not present

## 2017-08-18 DIAGNOSIS — G43009 Migraine without aura, not intractable, without status migrainosus: Secondary | ICD-10-CM | POA: Diagnosis not present

## 2017-08-18 DIAGNOSIS — D509 Iron deficiency anemia, unspecified: Secondary | ICD-10-CM | POA: Diagnosis not present

## 2017-08-18 DIAGNOSIS — R311 Benign essential microscopic hematuria: Secondary | ICD-10-CM | POA: Diagnosis not present

## 2017-08-18 DIAGNOSIS — Z79899 Other long term (current) drug therapy: Secondary | ICD-10-CM | POA: Diagnosis not present

## 2017-08-18 DIAGNOSIS — Z23 Encounter for immunization: Secondary | ICD-10-CM | POA: Diagnosis not present

## 2017-08-18 DIAGNOSIS — Z0000 Encounter for general adult medical examination without abnormal findings: Secondary | ICD-10-CM | POA: Diagnosis not present

## 2017-08-18 DIAGNOSIS — E559 Vitamin D deficiency, unspecified: Secondary | ICD-10-CM | POA: Diagnosis not present

## 2017-08-19 DIAGNOSIS — H401122 Primary open-angle glaucoma, left eye, moderate stage: Secondary | ICD-10-CM | POA: Diagnosis not present

## 2017-08-19 DIAGNOSIS — H401111 Primary open-angle glaucoma, right eye, mild stage: Secondary | ICD-10-CM | POA: Diagnosis not present

## 2017-10-16 DIAGNOSIS — H401122 Primary open-angle glaucoma, left eye, moderate stage: Secondary | ICD-10-CM | POA: Diagnosis not present

## 2017-10-16 DIAGNOSIS — H401111 Primary open-angle glaucoma, right eye, mild stage: Secondary | ICD-10-CM | POA: Diagnosis not present

## 2017-10-16 DIAGNOSIS — H02831 Dermatochalasis of right upper eyelid: Secondary | ICD-10-CM | POA: Diagnosis not present

## 2017-10-16 DIAGNOSIS — H02834 Dermatochalasis of left upper eyelid: Secondary | ICD-10-CM | POA: Diagnosis not present

## 2018-01-07 DIAGNOSIS — B351 Tinea unguium: Secondary | ICD-10-CM | POA: Diagnosis not present

## 2018-01-07 DIAGNOSIS — Z85828 Personal history of other malignant neoplasm of skin: Secondary | ICD-10-CM | POA: Diagnosis not present

## 2018-01-07 DIAGNOSIS — L570 Actinic keratosis: Secondary | ICD-10-CM | POA: Diagnosis not present

## 2018-01-07 DIAGNOSIS — C44519 Basal cell carcinoma of skin of other part of trunk: Secondary | ICD-10-CM | POA: Diagnosis not present

## 2018-01-07 DIAGNOSIS — D2372 Other benign neoplasm of skin of left lower limb, including hip: Secondary | ICD-10-CM | POA: Diagnosis not present

## 2018-01-07 DIAGNOSIS — L814 Other melanin hyperpigmentation: Secondary | ICD-10-CM | POA: Diagnosis not present

## 2018-01-20 DIAGNOSIS — R319 Hematuria, unspecified: Secondary | ICD-10-CM | POA: Diagnosis not present

## 2018-01-27 DIAGNOSIS — R319 Hematuria, unspecified: Secondary | ICD-10-CM | POA: Diagnosis not present

## 2018-03-19 DIAGNOSIS — H43811 Vitreous degeneration, right eye: Secondary | ICD-10-CM | POA: Diagnosis not present

## 2018-04-09 DIAGNOSIS — Z23 Encounter for immunization: Secondary | ICD-10-CM | POA: Diagnosis not present

## 2018-04-26 DIAGNOSIS — H401122 Primary open-angle glaucoma, left eye, moderate stage: Secondary | ICD-10-CM | POA: Diagnosis not present

## 2018-04-26 DIAGNOSIS — H43812 Vitreous degeneration, left eye: Secondary | ICD-10-CM | POA: Diagnosis not present

## 2018-04-26 DIAGNOSIS — H52201 Unspecified astigmatism, right eye: Secondary | ICD-10-CM | POA: Diagnosis not present

## 2018-04-26 DIAGNOSIS — H401111 Primary open-angle glaucoma, right eye, mild stage: Secondary | ICD-10-CM | POA: Diagnosis not present

## 2018-05-17 DIAGNOSIS — Z713 Dietary counseling and surveillance: Secondary | ICD-10-CM | POA: Diagnosis not present

## 2018-05-26 ENCOUNTER — Encounter: Payer: Self-pay | Admitting: Podiatry

## 2018-05-26 ENCOUNTER — Ambulatory Visit (INDEPENDENT_AMBULATORY_CARE_PROVIDER_SITE_OTHER): Payer: BLUE CROSS/BLUE SHIELD | Admitting: Podiatry

## 2018-05-26 DIAGNOSIS — M79675 Pain in left toe(s): Secondary | ICD-10-CM | POA: Diagnosis not present

## 2018-05-26 DIAGNOSIS — Z713 Dietary counseling and surveillance: Secondary | ICD-10-CM | POA: Diagnosis not present

## 2018-05-26 DIAGNOSIS — M79674 Pain in right toe(s): Secondary | ICD-10-CM | POA: Diagnosis not present

## 2018-05-26 DIAGNOSIS — B351 Tinea unguium: Secondary | ICD-10-CM

## 2018-05-26 NOTE — Progress Notes (Signed)
Subjective:   Patient ID: Andrew Coffey, male   DOB: 67 y.o.   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

## 2018-05-27 DIAGNOSIS — Z713 Dietary counseling and surveillance: Secondary | ICD-10-CM | POA: Diagnosis not present

## 2018-06-09 DIAGNOSIS — N400 Enlarged prostate without lower urinary tract symptoms: Secondary | ICD-10-CM | POA: Diagnosis not present

## 2018-06-15 DIAGNOSIS — N400 Enlarged prostate without lower urinary tract symptoms: Secondary | ICD-10-CM | POA: Diagnosis not present

## 2018-06-16 DIAGNOSIS — Z713 Dietary counseling and surveillance: Secondary | ICD-10-CM | POA: Diagnosis not present

## 2018-06-30 DIAGNOSIS — Z713 Dietary counseling and surveillance: Secondary | ICD-10-CM | POA: Diagnosis not present

## 2018-07-01 DIAGNOSIS — Z86010 Personal history of colonic polyps: Secondary | ICD-10-CM | POA: Diagnosis not present

## 2018-07-01 DIAGNOSIS — D124 Benign neoplasm of descending colon: Secondary | ICD-10-CM | POA: Diagnosis not present

## 2018-07-01 DIAGNOSIS — K5730 Diverticulosis of large intestine without perforation or abscess without bleeding: Secondary | ICD-10-CM | POA: Diagnosis not present

## 2018-07-01 DIAGNOSIS — D120 Benign neoplasm of cecum: Secondary | ICD-10-CM | POA: Diagnosis not present

## 2018-07-01 DIAGNOSIS — D125 Benign neoplasm of sigmoid colon: Secondary | ICD-10-CM | POA: Diagnosis not present

## 2018-07-01 DIAGNOSIS — D123 Benign neoplasm of transverse colon: Secondary | ICD-10-CM | POA: Diagnosis not present

## 2018-07-06 DIAGNOSIS — D125 Benign neoplasm of sigmoid colon: Secondary | ICD-10-CM | POA: Diagnosis not present

## 2018-07-06 DIAGNOSIS — D120 Benign neoplasm of cecum: Secondary | ICD-10-CM | POA: Diagnosis not present

## 2018-07-06 DIAGNOSIS — D124 Benign neoplasm of descending colon: Secondary | ICD-10-CM | POA: Diagnosis not present

## 2018-07-06 DIAGNOSIS — D123 Benign neoplasm of transverse colon: Secondary | ICD-10-CM | POA: Diagnosis not present

## 2018-07-19 DIAGNOSIS — Z713 Dietary counseling and surveillance: Secondary | ICD-10-CM | POA: Diagnosis not present

## 2018-07-20 DIAGNOSIS — J0101 Acute recurrent maxillary sinusitis: Secondary | ICD-10-CM | POA: Diagnosis not present

## 2018-07-28 DIAGNOSIS — Z713 Dietary counseling and surveillance: Secondary | ICD-10-CM | POA: Diagnosis not present

## 2018-08-20 DIAGNOSIS — B351 Tinea unguium: Secondary | ICD-10-CM | POA: Diagnosis not present

## 2018-08-20 DIAGNOSIS — B356 Tinea cruris: Secondary | ICD-10-CM | POA: Diagnosis not present

## 2018-08-23 DIAGNOSIS — Z0000 Encounter for general adult medical examination without abnormal findings: Secondary | ICD-10-CM | POA: Diagnosis not present

## 2018-08-23 DIAGNOSIS — Z13220 Encounter for screening for lipoid disorders: Secondary | ICD-10-CM | POA: Diagnosis not present

## 2018-08-23 DIAGNOSIS — E559 Vitamin D deficiency, unspecified: Secondary | ICD-10-CM | POA: Diagnosis not present

## 2018-08-23 DIAGNOSIS — D509 Iron deficiency anemia, unspecified: Secondary | ICD-10-CM | POA: Diagnosis not present

## 2018-08-23 DIAGNOSIS — R311 Benign essential microscopic hematuria: Secondary | ICD-10-CM | POA: Diagnosis not present

## 2018-08-23 DIAGNOSIS — Z125 Encounter for screening for malignant neoplasm of prostate: Secondary | ICD-10-CM | POA: Diagnosis not present

## 2018-08-23 DIAGNOSIS — H40059 Ocular hypertension, unspecified eye: Secondary | ICD-10-CM | POA: Diagnosis not present

## 2018-08-23 DIAGNOSIS — G43009 Migraine without aura, not intractable, without status migrainosus: Secondary | ICD-10-CM | POA: Diagnosis not present

## 2018-08-25 ENCOUNTER — Ambulatory Visit (INDEPENDENT_AMBULATORY_CARE_PROVIDER_SITE_OTHER): Payer: BLUE CROSS/BLUE SHIELD | Admitting: Podiatry

## 2018-08-25 ENCOUNTER — Encounter: Payer: Self-pay | Admitting: Podiatry

## 2018-08-25 DIAGNOSIS — M79674 Pain in right toe(s): Secondary | ICD-10-CM | POA: Diagnosis not present

## 2018-08-25 DIAGNOSIS — M79675 Pain in left toe(s): Secondary | ICD-10-CM | POA: Diagnosis not present

## 2018-08-25 DIAGNOSIS — B351 Tinea unguium: Secondary | ICD-10-CM | POA: Diagnosis not present

## 2018-08-25 NOTE — Progress Notes (Signed)
Subjective:   Patient ID: Andrew Coffey, male   DOB: 68 y.o.   MRN: [id number]   HPI Patient presents stating that I want to discuss laser treatment and I have started on an antifungal for my skin and I like to see if I can do something to improve these nails   ROS      Objective:  Physical Exam  Neurovascular status intact with patient's nail showing discoloration 1 through 5 bilateral with long-term history of issue     Assessment:  Mycotic nail infection bilateral long-term in nature     Plan:  H&P discussed condition and I have recommended at this time laser therapy along with the oral medicine is on.  I explained at great length there is absolutely no long-term guarantees this will make a difference for him but I do think it is worth it to try to see whether or not we can make positive change for him

## 2018-09-03 ENCOUNTER — Other Ambulatory Visit: Payer: Self-pay

## 2018-09-03 ENCOUNTER — Ambulatory Visit: Payer: Self-pay

## 2018-09-03 DIAGNOSIS — M79675 Pain in left toe(s): Secondary | ICD-10-CM

## 2018-09-03 DIAGNOSIS — B351 Tinea unguium: Secondary | ICD-10-CM

## 2018-09-03 DIAGNOSIS — M79674 Pain in right toe(s): Principal | ICD-10-CM

## 2018-09-07 NOTE — Progress Notes (Signed)
Pt presents with mycotic infection of nails 1 through 5 bilateral.  All other systems are negative  Laser therapy administered to affected nails and tolerated well. All safety precautions were in place.  Second treatment.  Follow up in 4 weeks     

## 2018-10-01 ENCOUNTER — Ambulatory Visit: Payer: Self-pay

## 2018-10-01 ENCOUNTER — Other Ambulatory Visit: Payer: Self-pay

## 2018-10-01 DIAGNOSIS — B351 Tinea unguium: Secondary | ICD-10-CM

## 2018-10-01 NOTE — Progress Notes (Signed)
Pt presents with mycotic infection of nails 1 through 5 bilateral.  All other systems are negative  Laser therapy administered to affected nails and tolerated well. All safety precautions were in place. 3rd treatment.  Follow up in 4 weeks     

## 2018-10-12 DIAGNOSIS — H401122 Primary open-angle glaucoma, left eye, moderate stage: Secondary | ICD-10-CM | POA: Diagnosis not present

## 2018-10-12 DIAGNOSIS — H401111 Primary open-angle glaucoma, right eye, mild stage: Secondary | ICD-10-CM | POA: Diagnosis not present

## 2018-10-28 ENCOUNTER — Other Ambulatory Visit: Payer: BLUE CROSS/BLUE SHIELD

## 2018-10-29 ENCOUNTER — Ambulatory Visit (INDEPENDENT_AMBULATORY_CARE_PROVIDER_SITE_OTHER): Payer: BLUE CROSS/BLUE SHIELD

## 2018-10-29 ENCOUNTER — Other Ambulatory Visit: Payer: Self-pay

## 2018-10-29 DIAGNOSIS — B351 Tinea unguium: Secondary | ICD-10-CM

## 2018-10-29 NOTE — Progress Notes (Signed)
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 All safety precautions were in place.  4th treatment.  Follow up in 4 weeks

## 2018-11-26 ENCOUNTER — Other Ambulatory Visit: Payer: Self-pay

## 2018-11-26 ENCOUNTER — Ambulatory Visit: Payer: Self-pay

## 2018-11-26 DIAGNOSIS — B351 Tinea unguium: Secondary | ICD-10-CM

## 2018-11-26 NOTE — Progress Notes (Signed)
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 All safety precautions were in place.  5th treatment.  Follow up in 4 weeks, this will possibly be his last treatment

## 2018-12-28 DIAGNOSIS — B351 Tinea unguium: Secondary | ICD-10-CM | POA: Diagnosis not present

## 2018-12-28 DIAGNOSIS — L820 Inflamed seborrheic keratosis: Secondary | ICD-10-CM | POA: Diagnosis not present

## 2018-12-31 ENCOUNTER — Ambulatory Visit: Payer: BC Managed Care – PPO | Admitting: Podiatry

## 2018-12-31 ENCOUNTER — Other Ambulatory Visit: Payer: Self-pay

## 2018-12-31 DIAGNOSIS — B351 Tinea unguium: Secondary | ICD-10-CM

## 2019-01-04 NOTE — Progress Notes (Signed)
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 All safety precautions were in place.  6th treatment.  Follow up in 4 weeks, this will possibly be his last treatment

## 2019-01-12 DIAGNOSIS — L570 Actinic keratosis: Secondary | ICD-10-CM | POA: Diagnosis not present

## 2019-01-12 DIAGNOSIS — D1801 Hemangioma of skin and subcutaneous tissue: Secondary | ICD-10-CM | POA: Diagnosis not present

## 2019-01-12 DIAGNOSIS — Z85828 Personal history of other malignant neoplasm of skin: Secondary | ICD-10-CM | POA: Diagnosis not present

## 2019-01-12 DIAGNOSIS — D225 Melanocytic nevi of trunk: Secondary | ICD-10-CM | POA: Diagnosis not present

## 2019-01-12 DIAGNOSIS — L821 Other seborrheic keratosis: Secondary | ICD-10-CM | POA: Diagnosis not present

## 2019-02-01 ENCOUNTER — Ambulatory Visit: Payer: Self-pay | Admitting: Podiatry

## 2019-02-01 ENCOUNTER — Other Ambulatory Visit: Payer: Self-pay

## 2019-02-01 DIAGNOSIS — B351 Tinea unguium: Secondary | ICD-10-CM

## 2019-02-15 DIAGNOSIS — H26492 Other secondary cataract, left eye: Secondary | ICD-10-CM | POA: Diagnosis not present

## 2019-02-15 DIAGNOSIS — H401111 Primary open-angle glaucoma, right eye, mild stage: Secondary | ICD-10-CM | POA: Diagnosis not present

## 2019-02-15 DIAGNOSIS — H401122 Primary open-angle glaucoma, left eye, moderate stage: Secondary | ICD-10-CM | POA: Diagnosis not present

## 2019-02-24 NOTE — Progress Notes (Signed)
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 All safety precautions were in place.  6th treatment.  Follow up prn

## 2019-03-04 ENCOUNTER — Other Ambulatory Visit: Payer: Self-pay

## 2019-03-04 ENCOUNTER — Ambulatory Visit: Payer: Self-pay | Admitting: Podiatry

## 2019-03-04 DIAGNOSIS — M79676 Pain in unspecified toe(s): Secondary | ICD-10-CM

## 2019-03-04 DIAGNOSIS — B351 Tinea unguium: Secondary | ICD-10-CM

## 2019-04-08 NOTE — Progress Notes (Signed)
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 All safety precautions were in place.  7th treatment.  Follow up prn

## 2019-04-14 DIAGNOSIS — Z23 Encounter for immunization: Secondary | ICD-10-CM | POA: Diagnosis not present

## 2019-05-03 DIAGNOSIS — Z713 Dietary counseling and surveillance: Secondary | ICD-10-CM | POA: Diagnosis not present

## 2019-05-17 DIAGNOSIS — Z713 Dietary counseling and surveillance: Secondary | ICD-10-CM | POA: Diagnosis not present

## 2019-05-31 DIAGNOSIS — Z713 Dietary counseling and surveillance: Secondary | ICD-10-CM | POA: Diagnosis not present

## 2019-06-14 DIAGNOSIS — Z713 Dietary counseling and surveillance: Secondary | ICD-10-CM | POA: Diagnosis not present

## 2019-06-28 DIAGNOSIS — Z713 Dietary counseling and surveillance: Secondary | ICD-10-CM | POA: Diagnosis not present

## 2019-07-19 DIAGNOSIS — Z713 Dietary counseling and surveillance: Secondary | ICD-10-CM | POA: Diagnosis not present

## 2019-07-26 DIAGNOSIS — H401122 Primary open-angle glaucoma, left eye, moderate stage: Secondary | ICD-10-CM | POA: Diagnosis not present

## 2019-07-26 DIAGNOSIS — H401111 Primary open-angle glaucoma, right eye, mild stage: Secondary | ICD-10-CM | POA: Diagnosis not present

## 2019-07-29 DIAGNOSIS — D485 Neoplasm of uncertain behavior of skin: Secondary | ICD-10-CM | POA: Diagnosis not present

## 2019-07-29 DIAGNOSIS — B351 Tinea unguium: Secondary | ICD-10-CM | POA: Diagnosis not present

## 2019-08-03 ENCOUNTER — Ambulatory Visit: Payer: Self-pay | Attending: Internal Medicine

## 2019-08-03 DIAGNOSIS — Z23 Encounter for immunization: Secondary | ICD-10-CM | POA: Insufficient documentation

## 2019-08-03 NOTE — Progress Notes (Signed)
   Covid-19 Vaccination Clinic  Name:  Andrew Coffey    MRN: [id number] DOB: [birth year]  08/03/2019  [name] was observed post Covid-19 immunization for 15 minutes without incidence. He was provided with Vaccine Information Sheet and instruction to access the V-Safe system.   [name] was instructed to call 911 with any severe reactions post vaccine: Marland Kitchen Difficulty breathing  . Swelling of your face and throat  . A fast heartbeat  . A bad rash all over your body  . Dizziness and weakness    Immunizations Administered    Name Date Dose VIS Date Route   Pfizer COVID-19 Vaccine 08/03/2019  1:21 PM 0.3 mL 06/24/2019 Intramuscular   Manufacturer: [place]   Lot: [id number]   [place]: [id number]

## 2019-08-15 DIAGNOSIS — H9202 Otalgia, left ear: Secondary | ICD-10-CM | POA: Diagnosis not present

## 2019-08-24 ENCOUNTER — Ambulatory Visit: Payer: Self-pay | Attending: Internal Medicine

## 2019-08-24 DIAGNOSIS — Z23 Encounter for immunization: Secondary | ICD-10-CM | POA: Insufficient documentation

## 2019-08-24 NOTE — Progress Notes (Signed)
   Covid-19 Vaccination Clinic  Name:  Andrew Coffey    MRN: [id number] DOB: [birth year]  08/24/2019  Mr. Andrew Coffey was observed post Covid-19 immunization for 15 minutes without incidence. He was provided with Vaccine Information Sheet and instruction to access the V-Safe system.   Mr. Andrew Coffey was instructed to call 911 with any severe reactions post vaccine: Marland Kitchen Difficulty breathing  . Swelling of your face and throat  . A fast heartbeat  . A bad rash all over your body  . Dizziness and weakness    Immunizations Administered    Name Date Dose VIS Date Route   Pfizer COVID-19 Vaccine 08/24/2019  9:30 AM 0.3 mL 06/24/2019 Intramuscular   Manufacturer: [place]   Lot: [id number]   [place]: [id number]

## 2019-09-06 DIAGNOSIS — E559 Vitamin D deficiency, unspecified: Secondary | ICD-10-CM | POA: Diagnosis not present

## 2019-09-06 DIAGNOSIS — R311 Benign essential microscopic hematuria: Secondary | ICD-10-CM | POA: Diagnosis not present

## 2019-09-06 DIAGNOSIS — Z13220 Encounter for screening for lipoid disorders: Secondary | ICD-10-CM | POA: Diagnosis not present

## 2019-09-06 DIAGNOSIS — H40059 Ocular hypertension, unspecified eye: Secondary | ICD-10-CM | POA: Diagnosis not present

## 2019-09-06 DIAGNOSIS — Z79899 Other long term (current) drug therapy: Secondary | ICD-10-CM | POA: Diagnosis not present

## 2019-09-06 DIAGNOSIS — G4700 Insomnia, unspecified: Secondary | ICD-10-CM | POA: Diagnosis not present

## 2019-09-06 DIAGNOSIS — Z125 Encounter for screening for malignant neoplasm of prostate: Secondary | ICD-10-CM | POA: Diagnosis not present

## 2019-09-06 DIAGNOSIS — Z0000 Encounter for general adult medical examination without abnormal findings: Secondary | ICD-10-CM | POA: Diagnosis not present

## 2019-09-06 DIAGNOSIS — D509 Iron deficiency anemia, unspecified: Secondary | ICD-10-CM | POA: Diagnosis not present

## 2019-09-06 DIAGNOSIS — G43009 Migraine without aura, not intractable, without status migrainosus: Secondary | ICD-10-CM | POA: Diagnosis not present

## 2019-09-08 ENCOUNTER — Ambulatory Visit: Payer: BLUE CROSS/BLUE SHIELD

## 2019-11-10 ENCOUNTER — Ambulatory Visit
Admission: RE | Admit: 2019-11-10 | Discharge: 2019-11-10 | Disposition: A | Discharge status: Home / Self Care | Payer: BC Managed Care – PPO | Source: Ambulatory Visit | Attending: Internal Medicine | Admitting: Internal Medicine

## 2019-11-10 ENCOUNTER — Other Ambulatory Visit: Payer: Self-pay | Admitting: Internal Medicine

## 2019-11-10 DIAGNOSIS — R1032 Left lower quadrant pain: Secondary | ICD-10-CM

## 2019-11-10 DIAGNOSIS — R1030 Lower abdominal pain, unspecified: Secondary | ICD-10-CM | POA: Diagnosis not present

## 2019-12-27 DIAGNOSIS — J019 Acute sinusitis, unspecified: Secondary | ICD-10-CM | POA: Diagnosis not present

## 2020-01-25 DIAGNOSIS — H524 Presbyopia: Secondary | ICD-10-CM | POA: Diagnosis not present

## 2020-01-25 DIAGNOSIS — H401111 Primary open-angle glaucoma, right eye, mild stage: Secondary | ICD-10-CM | POA: Diagnosis not present

## 2020-01-25 DIAGNOSIS — H401122 Primary open-angle glaucoma, left eye, moderate stage: Secondary | ICD-10-CM | POA: Diagnosis not present

## 2020-01-25 DIAGNOSIS — H26492 Other secondary cataract, left eye: Secondary | ICD-10-CM | POA: Diagnosis not present

## 2020-01-26 DIAGNOSIS — Z713 Dietary counseling and surveillance: Secondary | ICD-10-CM | POA: Diagnosis not present

## 2020-01-31 DIAGNOSIS — L814 Other melanin hyperpigmentation: Secondary | ICD-10-CM | POA: Diagnosis not present

## 2020-01-31 DIAGNOSIS — Z85828 Personal history of other malignant neoplasm of skin: Secondary | ICD-10-CM | POA: Diagnosis not present

## 2020-01-31 DIAGNOSIS — L570 Actinic keratosis: Secondary | ICD-10-CM | POA: Diagnosis not present

## 2020-01-31 DIAGNOSIS — D225 Melanocytic nevi of trunk: Secondary | ICD-10-CM | POA: Diagnosis not present

## 2020-01-31 DIAGNOSIS — D1801 Hemangioma of skin and subcutaneous tissue: Secondary | ICD-10-CM | POA: Diagnosis not present

## 2020-03-01 DIAGNOSIS — H26492 Other secondary cataract, left eye: Secondary | ICD-10-CM | POA: Diagnosis not present

## 2020-04-20 DIAGNOSIS — Z23 Encounter for immunization: Secondary | ICD-10-CM | POA: Diagnosis not present

## 2020-04-28 ENCOUNTER — Ambulatory Visit: Payer: BC Managed Care – PPO | Attending: Internal Medicine

## 2020-04-28 DIAGNOSIS — Z23 Encounter for immunization: Secondary | ICD-10-CM

## 2020-04-28 NOTE — Progress Notes (Signed)
   Covid-19 Vaccination Clinic  Name:  Andrew Coffey    MRN: [id number] DOB: [birth year]  04/28/2020  Andrew Coffey was observed post Covid-19 immunization for 15 minutes without incident. He was provided with Vaccine Information Sheet and instruction to access the V-Safe system.   Andrew Coffey was instructed to call 911 with any severe reactions post vaccine: Marland Kitchen Difficulty breathing  . Swelling of face and throat  . A fast heartbeat  . A bad rash all over body  . Dizziness and weakness

## 2020-05-15 DIAGNOSIS — Z713 Dietary counseling and surveillance: Secondary | ICD-10-CM | POA: Diagnosis not present

## 2020-05-29 DIAGNOSIS — Z713 Dietary counseling and surveillance: Secondary | ICD-10-CM | POA: Diagnosis not present

## 2020-06-12 DIAGNOSIS — Z713 Dietary counseling and surveillance: Secondary | ICD-10-CM | POA: Diagnosis not present

## 2020-06-26 DIAGNOSIS — Z713 Dietary counseling and surveillance: Secondary | ICD-10-CM | POA: Diagnosis not present

## 2020-07-18 DIAGNOSIS — L820 Inflamed seborrheic keratosis: Secondary | ICD-10-CM | POA: Diagnosis not present

## 2020-07-24 DIAGNOSIS — Z713 Dietary counseling and surveillance: Secondary | ICD-10-CM | POA: Diagnosis not present

## 2020-09-03 DIAGNOSIS — Z125 Encounter for screening for malignant neoplasm of prostate: Secondary | ICD-10-CM | POA: Diagnosis not present

## 2020-09-03 DIAGNOSIS — N281 Cyst of kidney, acquired: Secondary | ICD-10-CM | POA: Diagnosis not present

## 2020-09-03 DIAGNOSIS — R311 Benign essential microscopic hematuria: Secondary | ICD-10-CM | POA: Diagnosis not present

## 2020-09-03 DIAGNOSIS — N400 Enlarged prostate without lower urinary tract symptoms: Secondary | ICD-10-CM | POA: Diagnosis not present

## 2020-09-18 DIAGNOSIS — D509 Iron deficiency anemia, unspecified: Secondary | ICD-10-CM | POA: Diagnosis not present

## 2020-09-18 DIAGNOSIS — Z0000 Encounter for general adult medical examination without abnormal findings: Secondary | ICD-10-CM | POA: Diagnosis not present

## 2020-09-18 DIAGNOSIS — R1032 Left lower quadrant pain: Secondary | ICD-10-CM | POA: Diagnosis not present

## 2020-09-18 DIAGNOSIS — Z13220 Encounter for screening for lipoid disorders: Secondary | ICD-10-CM | POA: Diagnosis not present

## 2020-09-18 DIAGNOSIS — E559 Vitamin D deficiency, unspecified: Secondary | ICD-10-CM | POA: Diagnosis not present

## 2020-09-18 DIAGNOSIS — R311 Benign essential microscopic hematuria: Secondary | ICD-10-CM | POA: Diagnosis not present

## 2020-09-18 DIAGNOSIS — G43009 Migraine without aura, not intractable, without status migrainosus: Secondary | ICD-10-CM | POA: Diagnosis not present

## 2020-09-18 DIAGNOSIS — Q6100 Congenital renal cyst, unspecified: Secondary | ICD-10-CM | POA: Diagnosis not present

## 2020-09-21 DIAGNOSIS — R311 Benign essential microscopic hematuria: Secondary | ICD-10-CM | POA: Diagnosis not present

## 2020-09-21 DIAGNOSIS — N281 Cyst of kidney, acquired: Secondary | ICD-10-CM | POA: Diagnosis not present

## 2020-09-21 DIAGNOSIS — K429 Umbilical hernia without obstruction or gangrene: Secondary | ICD-10-CM | POA: Diagnosis not present

## 2020-11-09 ENCOUNTER — Ambulatory Visit: Payer: Self-pay | Admitting: General Surgery

## 2020-11-09 DIAGNOSIS — K439 Ventral hernia without obstruction or gangrene: Secondary | ICD-10-CM | POA: Diagnosis not present

## 2020-11-09 NOTE — H&P (Signed)
History of Present Illness The patient is a 70 year old male who presents with an abdominal wall hernia. Referred by: Dr. Louis Meckel Chief Complaint: Ventral hernia  Patient is a 70 year old male, who comes in secondary to a ventral hernia. Patient also with a large left kidney cyst. This approximately 11 cm. Patient is to undergo excision of kidney cyst. Patient would like to continue with concurrent ventral hernia repair.  Patient states there is been there for approximately 1 year at least. Patient states it is bothersome when he does do core exercises or strenuous activity. He's had no signs or symptoms of incarceration or strangulation.      Past Surgical History Cataract Surgery  Bilateral. Tonsillectomy  TURP   Diagnostic Studies History Colonoscopy  1-5 years ago  Allergies No Known Drug Allergies  [11/09/2020]: Allergies Reconciled   Medication History Sildenafil Citrate (10MG /12.5ML Solution, Intravenous) Active. Fluconazole (10MG /ML For Suspension, Oral) Active. Fluticasone Furoate (27.5MCG/SPRAY Suspension, Nasal) Active. Hydrocodone CP (2-2.5-5MG /5ML Syrup, Oral) Active. Ketorolac Tromethamine (15.75MG /SPRAY Solution, Nasal) Active. Latanoprost (0.005% Emulsion, Ophthalmic) Active. Multivitamin Adult (Oral) Active. Vitamin D (400UNIT Capsule, Oral) Active. Fluconazole (40MG /ML For Suspension, Oral) Active. Isometheptene Mucate (130MG  Tablet, Oral) Active. Terbinafine (1% Cream, External) Active. Latanoprost (0.005% Solution, Ophthalmic) Active. Isometheptene Mucate Active. Medications Reconciled  Social History Alcohol use  Moderate alcohol use. Caffeine use  Carbonated beverages, Coffee, Tea. No drug use  Tobacco use  Never smoker.  Family History Cancer  Mother. Heart Disease  Father. Heart disease in male family member before age 15   Other Problems Enlarged Prostate  Gastroesophageal Reflux Disease  Hemorrhoids   Kidney Stone  Melanoma  Migraine Headache  Umbilical Hernia Repair     Review of Systems General Not Present- Appetite Loss, Chills, Fatigue, Fever, Night Sweats, Weight Gain and Weight Loss. Skin Not Present- Change in Wart/Mole, Dryness, Hives, Jaundice, New Lesions, Non-Healing Wounds, Rash and Ulcer. HEENT Present- Hearing Loss. Not Present- Earache, Hoarseness, Nose Bleed, Oral Ulcers, Ringing in the Ears, Seasonal Allergies, Sinus Pain, Sore Throat, Visual Disturbances, Wears glasses/contact lenses and Yellow Eyes. Respiratory Present- Snoring. Not Present- Bloody sputum, Chronic Cough, Difficulty Breathing and Wheezing. Breast Not Present- Breast Mass, Breast Pain, Nipple Discharge and Skin Changes. Cardiovascular Not Present- Chest Pain, Difficulty Breathing Lying Down, Leg Cramps, Palpitations, Rapid Heart Rate, Shortness of Breath and Swelling of Extremities. Gastrointestinal Not Present- Abdominal Pain, Bloating, Bloody Stool, Change in Bowel Habits, Chronic diarrhea, Constipation, Difficulty Swallowing, Excessive gas, Gets full quickly at meals, Hemorrhoids, Indigestion, Nausea, Rectal Pain and Vomiting. Male Genitourinary Present- Blood in Urine. Not Present- Change in Urinary Stream, Frequency, Impotence, Nocturia, Painful Urination, Urgency and Urine Leakage. Musculoskeletal Not Present- Back Pain, Joint Pain, Joint Stiffness, Muscle Pain, Muscle Weakness and Swelling of Extremities. Neurological Not Present- Decreased Memory, Fainting, Headaches, Numbness, Seizures, Tingling, Tremor, Trouble walking and Weakness. Psychiatric Not Present- Anxiety, Bipolar, Change in Sleep Pattern, Depression, Fearful and Frequent crying. Endocrine Not Present- Cold Intolerance, Excessive Hunger, Hair Changes, Heat Intolerance, Hot flashes and New Diabetes. Hematology Not Present- Blood Thinners, Easy Bruising, Excessive bleeding, Gland problems, HIV and Persistent Infections. All other  systems negative       Physical Exam  The physical exam findings are as follows: Note: Constitutional: No acute distress, conversant, appears stated age  Eyes: Anicteric sclerae, moist conjunctiva, no lid lag  Neck: No thyromegaly, trachea midline, no cervical lymphadenopathy  Lungs: Clear to auscultation biilaterally, normal respiratory effot  Cardiovascular: regular rate & rhythm, no murmurs, no peripheal edema,  pedal pulses 2+  GI: Soft, no masses or hepatosplenomegaly, non-tender to palpation  MSK: Normal gait, no clubbing cyanosis, edema  Skin: No rashes, palpation reveals normal skin turgor  Psychiatric: Appropriate judgment and insight, oriented to person, place, and time  Abdomen Inspection Hernias - Ventral - Reducible (Approximately 2 cm.) .    Assessment & Plan VENTRAL HERNIA WITHOUT OBSTRUCTION OR GANGRENE (K43.9) Impression: Patient is a 70 year old male with a ventral umbilical hernia. Patient also with a large kidney cyst. Patient undergo concurrent ventral hernia repair in cyst excision by Dr. Louis Meckel. 1. The patient will like to proceed to the operating room for open ventral hernia repair with mesh.  2. I discussed with the patient the signs and symptoms of incarceration and strangulation and the need to proceed to the ER should they occur.  3. I discussed with the patient the risks and benefits of the procedure to include but not limited to: Infection, bleeding, damage to surrounding structures, possible need for further surgery, possible nerve pain, and possible recurrence. The patient was understanding and wishes to proceed.

## 2020-11-20 DIAGNOSIS — R55 Syncope and collapse: Secondary | ICD-10-CM | POA: Diagnosis not present

## 2020-11-20 DIAGNOSIS — Q6100 Congenital renal cyst, unspecified: Secondary | ICD-10-CM | POA: Diagnosis not present

## 2020-11-20 DIAGNOSIS — K429 Umbilical hernia without obstruction or gangrene: Secondary | ICD-10-CM | POA: Diagnosis not present

## 2020-11-21 ENCOUNTER — Other Ambulatory Visit: Payer: Self-pay | Admitting: Urology

## 2020-11-22 ENCOUNTER — Other Ambulatory Visit: Payer: Self-pay | Admitting: Urology

## 2020-12-13 ENCOUNTER — Ambulatory Visit: Payer: BC Managed Care – PPO | Attending: Internal Medicine

## 2020-12-13 ENCOUNTER — Other Ambulatory Visit (HOSPITAL_BASED_OUTPATIENT_CLINIC_OR_DEPARTMENT_OTHER): Payer: Self-pay

## 2020-12-13 ENCOUNTER — Other Ambulatory Visit: Payer: Self-pay

## 2020-12-13 DIAGNOSIS — Z23 Encounter for immunization: Secondary | ICD-10-CM

## 2020-12-13 MED ORDER — PFIZER-BIONT COVID-19 VAC-TRIS 30 MCG/0.3ML IM SUSP
INTRAMUSCULAR | 0 refills | Status: DC
  Filled 2020-12-13: qty 0.3, 1d supply, fill #0

## 2020-12-13 NOTE — Progress Notes (Signed)
   Covid-19 Vaccination Clinic  Name:  Andrew Coffey    MRN: [id number] DOB: 11/18/1950  12/13/2020  [name] was observed post Covid-19 immunization for 15 minutes without incident. He was provided with Vaccine Information Sheet and instruction to access the V-Safe system.   [name] was instructed to call 911 with any severe reactions post vaccine: Marland Kitchen Difficulty breathing  . Swelling of face and throat  . A fast heartbeat  . A bad rash all over body  . Dizziness and weakness   Immunizations Administered    Name Date Dose VIS Date Route   PFIZER Comrnaty([place]) Covid-19 Vaccine 12/13/2020  2:04 PM 0.3 mL 06/21/2020 Intramuscular   Manufacturer: [place]   Lot: [id number]   [place]: [phone]

## 2020-12-15 IMAGING — DX DG HIP (WITH OR WITHOUT PELVIS) 2-3V*L*
2 series · 2 of 2 positions shown · non-contrast
Comparison: None

CLINICAL DATA: LEFT groin pain for 4-5 days, no known injury

EXAM:
DG HIP (WITH OR WITHOUT PELVIS) 2-3V LEFT

[dg hip unilat w or w/o pelvis 2-3 views  (1 of 2)]
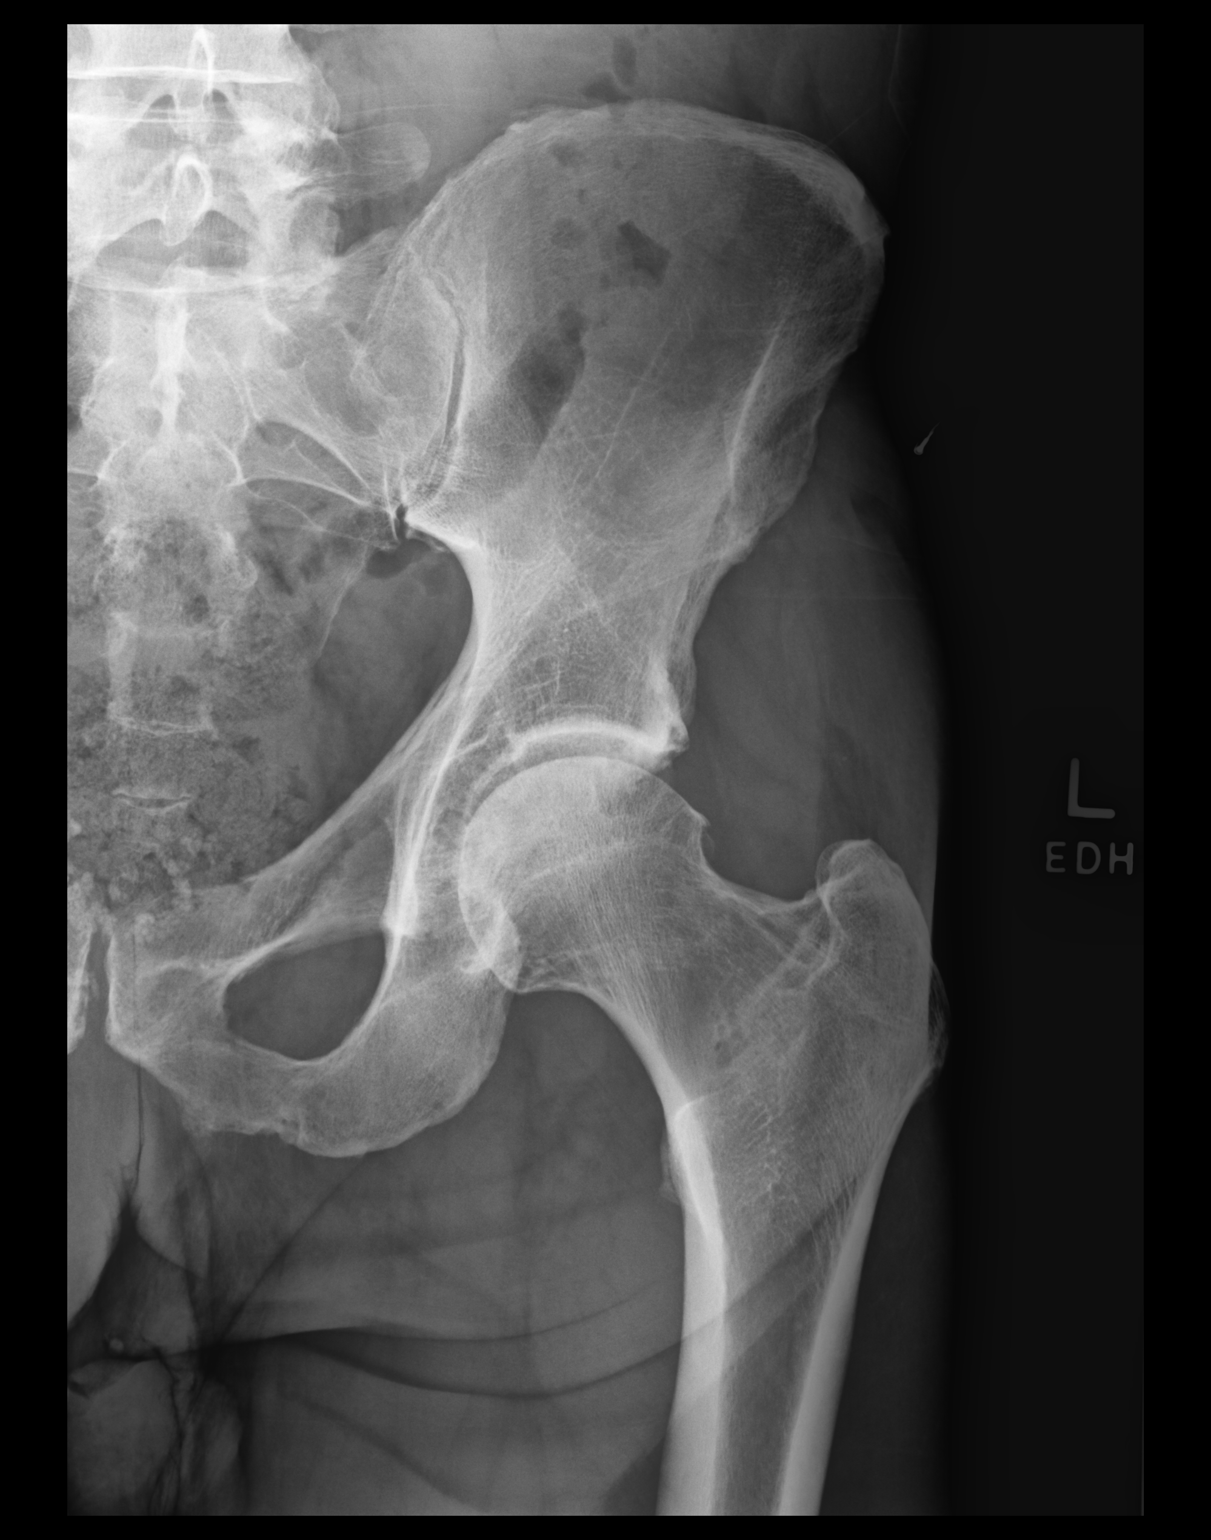

[dg hip unilat w or w/o pelvis 2-3 views  (2 of 2)]
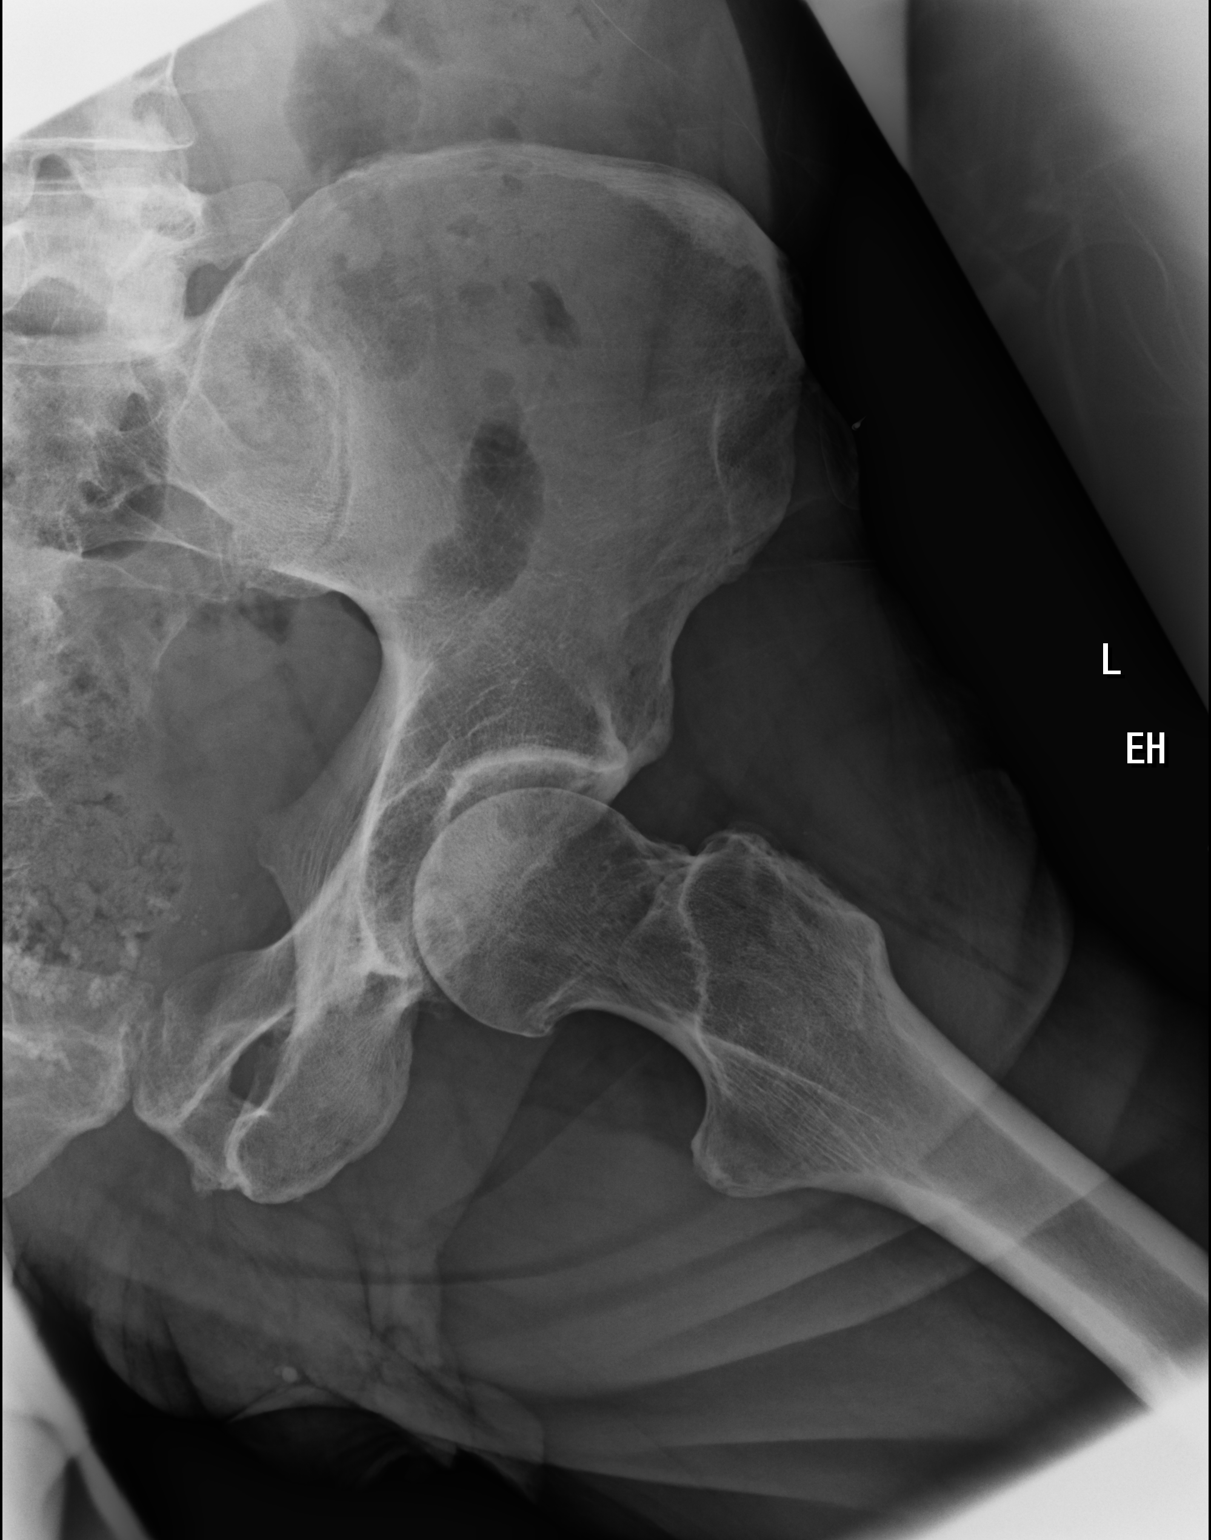

[2 of 2 positions shown; findings below may reference images not displayed]

FINDINGS: LEFT hip and SI joint spaces preserved.

Osseous mineralization grossly normal.

No acute fracture, dislocation, or bone destruction.

Numerous prostatic calcifications identified.
IMPRESSION: No acute osseous abnormalities.

## 2020-12-18 DIAGNOSIS — K4090 Unilateral inguinal hernia, without obstruction or gangrene, not specified as recurrent: Secondary | ICD-10-CM | POA: Diagnosis not present

## 2020-12-26 ENCOUNTER — Ambulatory Visit: Payer: Self-pay | Admitting: General Surgery

## 2020-12-26 DIAGNOSIS — K4090 Unilateral inguinal hernia, without obstruction or gangrene, not specified as recurrent: Secondary | ICD-10-CM | POA: Diagnosis not present

## 2020-12-26 DIAGNOSIS — K439 Ventral hernia without obstruction or gangrene: Secondary | ICD-10-CM | POA: Diagnosis not present

## 2021-01-11 NOTE — Patient Instructions (Addendum)
DUE TO COVID-19 ONLY ONE VISITOR IS ALLOWED TO COME WITH YOU AND STAY IN THE WAITING ROOM ONLY DURING PRE OP AND PROCEDURE DAY OF SURGERY. THE 1 VISITOR  MAY VISIT WITH YOU AFTER SURGERY IN YOUR PRIVATE ROOM DURING VISITING HOURS ONLY!               Andrew Coffey   Your procedure is scheduled on: 01/24/21   Report to [facility] Main  Entrance   Report to short stay at: 5:30 AM     Call this number if you have problems the morning of surgery [phone]    Remember: Do not eat food or drink liquids :After Midnight.   BRUSH YOUR TEETH MORNING OF SURGERY AND RINSE YOUR MOUTH OUT, NO CHEWING GUM CANDY OR MINTS.     Take these medicines the morning of surgery with A SIP OF WATER: N/A. Use eye drops as usual.                               You may not have any metal on your body including hair pins and              piercings  Do not wear jewelry,lotions, powders or perfumes, deodorant             Men may shave face and neck.   Do not bring valuables to the hospital. [place].  Contacts, dentures or bridgework may not be worn into surgery.  Leave suitcase in the car. After surgery it may be brought to your room.     Patients discharged the day of surgery will not be allowed to drive home. IF YOU ARE HAVING SURGERY AND GOING HOME THE SAME DAY, YOU MUST HAVE AN ADULT TO DRIVE YOU HOME AND BE WITH YOU FOR 24 HOURS. YOU MAY GO HOME BY TAXI OR UBER OR ORTHERWISE, BUT AN ADULT MUST ACCOMPANY YOU HOME AND STAY WITH YOU FOR 24 HOURS.  Name and phone number of your driver:  Special Instructions: N/A              Please read over the following fact sheets you were given: _____________________________________________________________________           [facility] - Preparing for Surgery Before surgery, you can play an important role.  Because skin is not sterile, your skin needs to be as free of germs as possible.  You can reduce the  number of germs on your skin by washing with CHG (chlorahexidine gluconate) soap before surgery.  CHG is an antiseptic cleaner which kills germs and bonds with the skin to continue killing germs even after washing. Please DO NOT use if you have an allergy to CHG or antibacterial soaps.  If your skin becomes reddened/irritated stop using the CHG and inform your nurse when you arrive at Short Stay. Do not shave (including legs and underarms) for at least 48 hours prior to the first CHG shower.  You may shave your face/neck. Please follow these instructions carefully:  1.  Shower with CHG Soap the night before surgery and the  morning of Surgery.  2.  If you choose to wash your hair, wash your hair first as usual with your  normal  shampoo.  3.  After you shampoo, rinse your hair and body thoroughly  to remove the  shampoo.                           4.  Use CHG as you would any other liquid soap.  You can apply chg directly  to the skin and wash                       Gently with a scrungie or clean washcloth.  5.  Apply the CHG Soap to your body ONLY FROM THE NECK DOWN.   Do not use on face/ open                           Wound or open sores. Avoid contact with eyes, ears mouth and genitals (private parts).                       Wash face,  Genitals (private parts) with your normal soap.             6.  Wash thoroughly, paying special attention to the area where your surgery  will be performed.  7.  Thoroughly rinse your body with warm water from the neck down.  8.  DO NOT shower/wash with your normal soap after using and rinsing off  the CHG Soap.                9.  Pat yourself dry with a clean towel.            10.  Wear clean pajamas.            11.  Place clean sheets on your bed the night of your first shower and do not  sleep with pets. Day of Surgery : Do not apply any lotions/deodorants the morning of surgery.  Please wear clean clothes to the hospital/surgery center.  FAILURE TO FOLLOW  THESE INSTRUCTIONS MAY RESULT IN THE CANCELLATION OF YOUR SURGERY PATIENT SIGNATURE_________________________________  NURSE SIGNATURE__________________________________  ________________________________________________________________________

## 2021-01-16 ENCOUNTER — Other Ambulatory Visit: Payer: Self-pay

## 2021-01-16 ENCOUNTER — Encounter (HOSPITAL_COMMUNITY): Payer: Self-pay

## 2021-01-16 ENCOUNTER — Encounter (HOSPITAL_COMMUNITY)
Admission: RE | Admit: 2021-01-16 | Discharge: 2021-01-16 | Disposition: A | Discharge status: Home / Self Care | Payer: BC Managed Care – PPO | Source: Ambulatory Visit | Attending: Urology | Admitting: Urology

## 2021-01-16 DIAGNOSIS — Z01812 Encounter for preprocedural laboratory examination: Secondary | ICD-10-CM | POA: Insufficient documentation

## 2021-01-16 HISTORY — DX: Malignant (primary) neoplasm, unspecified: C80.1

## 2021-01-16 LAB — CBC
HCT: 38.7 % — ABNORMAL LOW (ref 39.0–52.0)
Hemoglobin: 12.2 g/dL — ABNORMAL LOW (ref 13.0–17.0)
MCH: 29.3 pg (ref 26.0–34.0)
MCHC: 31.5 g/dL (ref 30.0–36.0)
MCV: 93 fL (ref 80.0–100.0)
Platelets: 218 10*3/uL (ref 150–400)
RBC: 4.16 MIL/uL — ABNORMAL LOW (ref 4.22–5.81)
RDW: 14.4 % (ref 11.5–15.5)
WBC: 5.8 10*3/uL (ref 4.0–10.5)
nRBC: 0 % (ref 0.0–0.2)

## 2021-01-16 NOTE — Progress Notes (Signed)
COVID Vaccine Completed: Yes Date COVID Vaccine completed: 12/13/20 COVID vaccine manufacturer: Pfizer      PCP - Dr. Josetta Huddle Cardiologist -   Chest x-ray -  EKG -  Stress Test -  ECHO -  Cardiac Cath -  Pacemaker/ICD device last checked:  Sleep Study -  CPAP -   Fasting Blood Sugar -  Checks Blood Sugar _____ times a day  Blood Thinner Instructions: Aspirin Instructions: Last Dose:  Anesthesia review:   Patient denies shortness of breath, fever, cough and chest pain at PAT appointment   Patient verbalized understanding of instructions that were given to them at the PAT appointment. Patient was also instructed that they will need to review over the PAT instructions again at home before surgery.

## 2021-01-17 DIAGNOSIS — R311 Benign essential microscopic hematuria: Secondary | ICD-10-CM | POA: Diagnosis not present

## 2021-01-17 DIAGNOSIS — N281 Cyst of kidney, acquired: Secondary | ICD-10-CM | POA: Diagnosis not present

## 2021-01-17 DIAGNOSIS — N390 Urinary tract infection, site not specified: Secondary | ICD-10-CM | POA: Diagnosis not present

## 2021-01-17 DIAGNOSIS — B951 Streptococcus, group B, as the cause of diseases classified elsewhere: Secondary | ICD-10-CM | POA: Diagnosis not present

## 2021-01-22 ENCOUNTER — Other Ambulatory Visit: Payer: BC Managed Care – PPO

## 2021-01-23 ENCOUNTER — Encounter (HOSPITAL_COMMUNITY): Payer: Self-pay | Admitting: Urology

## 2021-01-23 NOTE — Anesthesia Preprocedure Evaluation (Addendum)
Anesthesia Evaluation  Patient identified by MRN, date of birth, ID band Patient awake    Reviewed: Allergy & Precautions, NPO status , Patient's Chart, lab work & pertinent test results  Airway Mallampati: II  TM Distance: >3 FB Neck ROM: Full    Dental no notable dental hx. (+) Teeth Intact   Pulmonary neg pulmonary ROS,    Pulmonary exam normal breath sounds clear to auscultation       Cardiovascular Normal cardiovascular exam Rhythm:Regular Rate:Normal  Hx/o elevated BP's   Neuro/Psych  Headaches, Glaucoma negative psych ROS   GI/Hepatic Neg liver ROS, GERD  ,Hx/o esophageal stricture   Endo/Other    Renal/GU Renal diseaseLeft renal cyst   BPH with urinary retention    Musculoskeletal Right inguinal hernia Ventral hernia   Abdominal   Peds  Hematology  (+) anemia ,   Anesthesia Other Findings   Reproductive/Obstetrics                                                          Anesthesia Evaluation  Patient identified by MRN, date of birth, ID band Patient awake    Reviewed: Allergy & Precautions, NPO status , Patient's Chart, lab work & pertinent test results  Airway Mallampati: II  TM Distance: >3 FB Neck ROM: Full    Dental no notable dental hx.    Pulmonary neg pulmonary ROS,  breath sounds clear to auscultation  Pulmonary exam normal       Cardiovascular negative cardio ROS  Rhythm:Regular Rate:Normal     Neuro/Psych negative neurological ROS  negative psych ROS   GI/Hepatic Neg liver ROS, GERD-  Controlled,  Endo/Other  negative endocrine ROS  Renal/GU negative Renal ROS  negative genitourinary   Musculoskeletal negative musculoskeletal ROS (+)   Abdominal   Peds negative pediatric ROS (+)  Hematology negative hematology ROS (+)   Anesthesia Other Findings   Reproductive/Obstetrics negative OB ROS                              Anesthesia Physical Anesthesia Plan  ASA: II  Anesthesia Plan: General   Post-op Pain Management:    Induction: Intravenous  Airway Management Planned: LMA  Additional Equipment:   Intra-op Plan:   Post-operative Plan: Extubation in OR  Informed Consent: I have reviewed the patients History and Physical, chart, labs and discussed the procedure including the risks, benefits and alternatives for the proposed anesthesia with the patient or authorized representative who has indicated his/her understanding and acceptance.   Dental advisory given  Plan Discussed with: CRNA  Anesthesia Plan Comments:         Anesthesia Quick Evaluation  Anesthesia Physical Anesthesia Plan  ASA: 2  Anesthesia Plan: General   Post-op Pain Management:    Induction: Intravenous  PONV Risk Score and Plan: 4 or greater and Treatment may vary due to age or medical condition and Ondansetron  Airway Management Planned: Oral ETT  Additional Equipment:   Intra-op Plan:   Post-operative Plan: Extubation in OR  Informed Consent: I have reviewed the patients History and Physical, chart, labs and discussed the procedure including the risks, benefits and alternatives for the proposed anesthesia with the patient or authorized representative who has indicated his/her understanding and acceptance.  Dental advisory given  Plan Discussed with: CRNA and Anesthesiologist  Anesthesia Plan Comments:        Anesthesia Quick Evaluation

## 2021-01-24 ENCOUNTER — Observation Stay (HOSPITAL_COMMUNITY)
Admission: RE | Admit: 2021-01-24 | Discharge: 2021-01-25 | Disposition: A | Discharge status: Home / Self Care | Payer: BC Managed Care – PPO | Attending: Urology | Admitting: Urology

## 2021-01-24 ENCOUNTER — Ambulatory Visit (HOSPITAL_COMMUNITY): Payer: BC Managed Care – PPO | Admitting: Anesthesiology

## 2021-01-24 ENCOUNTER — Ambulatory Visit: Payer: Self-pay | Admitting: General Surgery

## 2021-01-24 ENCOUNTER — Encounter (HOSPITAL_COMMUNITY): Payer: Self-pay | Admitting: Urology

## 2021-01-24 ENCOUNTER — Other Ambulatory Visit: Payer: Self-pay

## 2021-01-24 ENCOUNTER — Encounter (HOSPITAL_COMMUNITY)
Admission: RE | Disposition: A | Discharge status: Home / Self Care | Payer: Self-pay | Source: Home / Self Care | Attending: Urology

## 2021-01-24 DIAGNOSIS — N281 Cyst of kidney, acquired: Secondary | ICD-10-CM | POA: Diagnosis not present

## 2021-01-24 DIAGNOSIS — R311 Benign essential microscopic hematuria: Secondary | ICD-10-CM | POA: Insufficient documentation

## 2021-01-24 DIAGNOSIS — K219 Gastro-esophageal reflux disease without esophagitis: Secondary | ICD-10-CM | POA: Diagnosis not present

## 2021-01-24 DIAGNOSIS — K4090 Unilateral inguinal hernia, without obstruction or gangrene, not specified as recurrent: Secondary | ICD-10-CM | POA: Diagnosis not present

## 2021-01-24 DIAGNOSIS — K439 Ventral hernia without obstruction or gangrene: Secondary | ICD-10-CM | POA: Insufficient documentation

## 2021-01-24 HISTORY — PX: INGUINAL HERNIA REPAIR: SHX194

## 2021-01-24 HISTORY — PX: LAPAROSCOPIC PARTIAL NEPHRECTOMY: SHX5910

## 2021-01-24 HISTORY — PX: VENTRAL HERNIA REPAIR: SHX424

## 2021-01-24 LAB — SAMPLE TO BLOOD BANK

## 2021-01-24 LAB — HEMOGLOBIN AND HEMATOCRIT, BLOOD
HCT: 34.5 % — ABNORMAL LOW (ref 39.0–52.0)
Hemoglobin: 11 g/dL — ABNORMAL LOW (ref 13.0–17.0)

## 2021-01-24 SURGERY — LAPAROSCOPIC PARTIAL NEPHRECTOMY
Anesthesia: General | Site: Flank | Laterality: Right

## 2021-01-24 MED ORDER — ACETAMINOPHEN 10 MG/ML IV SOLN
1000.0000 mg | Freq: Four times a day (QID) | INTRAVENOUS | Status: DC
  Administered 2021-01-24 – 2021-01-25 (×3): 1000 mg via INTRAVENOUS
  Filled 2021-01-24 (×3): qty 100

## 2021-01-24 MED ORDER — ONDANSETRON HCL 4 MG/2ML IJ SOLN
INTRAMUSCULAR | Status: AC
  Filled 2021-01-24: qty 2

## 2021-01-24 MED ORDER — 0.9 % SODIUM CHLORIDE (POUR BTL) OPTIME
TOPICAL | Status: DC | PRN
  Administered 2021-01-24: 1000 mL

## 2021-01-24 MED ORDER — MORPHINE SULFATE (PF) 2 MG/ML IV SOLN
2.0000 mg | INTRAVENOUS | Status: AC | PRN
  Administered 2021-01-24 (×2): 2 mg via INTRAVENOUS
  Filled 2021-01-24 (×2): qty 1

## 2021-01-24 MED ORDER — CEFAZOLIN SODIUM-DEXTROSE 1-4 GM/50ML-% IV SOLN
1.0000 g | Freq: Three times a day (TID) | INTRAVENOUS | Status: AC
  Administered 2021-01-24 – 2021-01-25 (×2): 1 g via INTRAVENOUS
  Filled 2021-01-24 (×2): qty 50

## 2021-01-24 MED ORDER — ROCURONIUM BROMIDE 10 MG/ML (PF) SYRINGE
PREFILLED_SYRINGE | INTRAVENOUS | Status: AC
  Filled 2021-01-24: qty 10

## 2021-01-24 MED ORDER — CHLORHEXIDINE GLUCONATE CLOTH 2 % EX PADS: 6.0000 | MEDICATED_PAD | Freq: Every day | CUTANEOUS | Status: DC

## 2021-01-24 MED ORDER — BUPIVACAINE-EPINEPHRINE 0.25% -1:200000 IJ SOLN
INTRAMUSCULAR | Status: DC | PRN
  Administered 2021-01-24: 12 mL

## 2021-01-24 MED ORDER — EPHEDRINE 5 MG/ML INJ
INTRAVENOUS | Status: AC
  Filled 2021-01-24: qty 10

## 2021-01-24 MED ORDER — LIDOCAINE HCL (CARDIAC) PF 100 MG/5ML IV SOSY
PREFILLED_SYRINGE | INTRAVENOUS | Status: DC | PRN
  Administered 2021-01-24: 60 mg via INTRAVENOUS

## 2021-01-24 MED ORDER — FENTANYL CITRATE (PF) 250 MCG/5ML IJ SOLN
INTRAMUSCULAR | Status: AC
  Filled 2021-01-24: qty 5

## 2021-01-24 MED ORDER — DEXAMETHASONE SODIUM PHOSPHATE 4 MG/ML IJ SOLN
INTRAMUSCULAR | Status: DC | PRN
  Administered 2021-01-24: 5 mg via INTRAVENOUS

## 2021-01-24 MED ORDER — HYDROMORPHONE HCL 1 MG/ML IJ SOLN
0.2500 mg | INTRAMUSCULAR | Status: DC | PRN
  Administered 2021-01-24: 0.5 mg via INTRAVENOUS

## 2021-01-24 MED ORDER — ONDANSETRON HCL 4 MG/2ML IJ SOLN: 4.0000 mg | Freq: Once | INTRAMUSCULAR | Status: DC | PRN

## 2021-01-24 MED ORDER — CHLORHEXIDINE GLUCONATE CLOTH 2 % EX PADS: 6.0000 | MEDICATED_PAD | Freq: Once | CUTANEOUS | Status: DC

## 2021-01-24 MED ORDER — HEMOSTATIC AGENTS (NO CHARGE) OPTIME
TOPICAL | Status: DC | PRN
  Administered 2021-01-24: 1 via TOPICAL

## 2021-01-24 MED ORDER — EPHEDRINE SULFATE 50 MG/ML IJ SOLN
INTRAMUSCULAR | Status: DC | PRN
  Administered 2021-01-24: 10 mg via INTRAVENOUS
  Administered 2021-01-24: 20 mg via INTRAVENOUS

## 2021-01-24 MED ORDER — PHENYLEPHRINE HCL (PRESSORS) 10 MG/ML IV SOLN
INTRAVENOUS | Status: DC | PRN
  Administered 2021-01-24 (×5): 80 ug via INTRAVENOUS

## 2021-01-24 MED ORDER — BUPIVACAINE LIPOSOME 1.3 % IJ SUSP
20.0000 mL | Freq: Once | INTRAMUSCULAR | Status: DC
  Filled 2021-01-24: qty 20

## 2021-01-24 MED ORDER — LIDOCAINE 2% (20 MG/ML) 5 ML SYRINGE
INTRAMUSCULAR | Status: AC
  Filled 2021-01-24: qty 5

## 2021-01-24 MED ORDER — ONDANSETRON HCL 4 MG/2ML IJ SOLN: 4.0000 mg | INTRAMUSCULAR | Status: DC | PRN

## 2021-01-24 MED ORDER — SUGAMMADEX SODIUM 200 MG/2ML IV SOLN
INTRAVENOUS | Status: DC | PRN
  Administered 2021-01-24: 200 mg via INTRAVENOUS

## 2021-01-24 MED ORDER — ACETAMINOPHEN 500 MG PO TABS
1000.0000 mg | ORAL_TABLET | ORAL | Status: AC
  Administered 2021-01-24: 1000 mg via ORAL
  Filled 2021-01-24: qty 2

## 2021-01-24 MED ORDER — DEXTROSE-NACL 5-0.45 % IV SOLN: INTRAVENOUS | Status: DC

## 2021-01-24 MED ORDER — ONDANSETRON HCL 4 MG/2ML IJ SOLN
INTRAMUSCULAR | Status: DC | PRN
  Administered 2021-01-24: 4 mg via INTRAVENOUS

## 2021-01-24 MED ORDER — MIDAZOLAM HCL 2 MG/2ML IJ SOLN
INTRAMUSCULAR | Status: AC
  Filled 2021-01-24: qty 2

## 2021-01-24 MED ORDER — FENTANYL CITRATE (PF) 100 MCG/2ML IJ SOLN
INTRAMUSCULAR | Status: DC | PRN
  Administered 2021-01-24 (×5): 50 ug via INTRAVENOUS
  Administered 2021-01-24: 100 ug via INTRAVENOUS
  Administered 2021-01-24 (×2): 50 ug via INTRAVENOUS

## 2021-01-24 MED ORDER — ROCURONIUM BROMIDE 100 MG/10ML IV SOLN
INTRAVENOUS | Status: DC | PRN
  Administered 2021-01-24 (×2): 20 mg via INTRAVENOUS
  Administered 2021-01-24: 60 mg via INTRAVENOUS
  Administered 2021-01-24 (×3): 20 mg via INTRAVENOUS

## 2021-01-24 MED ORDER — HYDROMORPHONE HCL 1 MG/ML IJ SOLN
INTRAMUSCULAR | Status: AC
  Administered 2021-01-24: 0.5 mg via INTRAVENOUS
  Filled 2021-01-24: qty 1

## 2021-01-24 MED ORDER — LACTATED RINGERS IV SOLN: INTRAVENOUS | Status: DC

## 2021-01-24 MED ORDER — TRAMADOL HCL 50 MG PO TABS: 50.0000 mg | ORAL_TABLET | Freq: Four times a day (QID) | ORAL | Status: DC | PRN

## 2021-01-24 MED ORDER — SODIUM CHLORIDE 0.9 % IV SOLN
2.0000 g | INTRAVENOUS | Status: AC
  Administered 2021-01-24: 2 g via INTRAVENOUS
  Filled 2021-01-24: qty 2

## 2021-01-24 MED ORDER — ALBUMIN HUMAN 5 % IV SOLN: INTRAVENOUS | Status: DC | PRN

## 2021-01-24 MED ORDER — LATANOPROST 0.005 % OP SOLN
1.0000 [drp] | Freq: Every day | OPHTHALMIC | Status: DC
  Administered 2021-01-25: 1 [drp] via OPHTHALMIC
  Filled 2021-01-24: qty 2.5

## 2021-01-24 MED ORDER — PROPOFOL 10 MG/ML IV BOLUS
INTRAVENOUS | Status: DC | PRN
  Administered 2021-01-24: 140 mg via INTRAVENOUS

## 2021-01-24 MED ORDER — TRAMADOL HCL 50 MG PO TABS
50.0000 mg | ORAL_TABLET | Freq: Four times a day (QID) | ORAL | Status: DC | PRN
  Administered 2021-01-24 – 2021-01-25 (×2): 100 mg via ORAL
  Filled 2021-01-24 (×2): qty 2

## 2021-01-24 MED ORDER — LACTATED RINGERS IR SOLN
Status: DC | PRN
  Administered 2021-01-24: 1000 mL

## 2021-01-24 MED ORDER — PROPOFOL 10 MG/ML IV BOLUS
INTRAVENOUS | Status: AC
  Filled 2021-01-24: qty 40

## 2021-01-24 MED ORDER — HYDROMORPHONE HCL 1 MG/ML IJ SOLN
INTRAMUSCULAR | Status: AC
  Filled 2021-01-24: qty 1

## 2021-01-24 MED ORDER — FENTANYL CITRATE (PF) 100 MCG/2ML IJ SOLN
INTRAMUSCULAR | Status: AC
  Filled 2021-01-24: qty 2

## 2021-01-24 MED ORDER — DEXAMETHASONE SODIUM PHOSPHATE 10 MG/ML IJ SOLN
INTRAMUSCULAR | Status: AC
  Filled 2021-01-24: qty 1

## 2021-01-24 MED ORDER — MIDAZOLAM HCL 5 MG/5ML IJ SOLN
INTRAMUSCULAR | Status: DC | PRN
  Administered 2021-01-24: 2 mg via INTRAVENOUS

## 2021-01-24 MED ORDER — CHLORHEXIDINE GLUCONATE 0.12 % MT SOLN: 15.0000 mL | Freq: Once | OROMUCOSAL | Status: AC

## 2021-01-24 MED ORDER — TRAMADOL HCL 50 MG PO TABS: 50.0000 mg | ORAL_TABLET | Freq: Four times a day (QID) | ORAL | 0 refills | Status: DC | PRN

## 2021-01-24 MED ORDER — ORAL CARE MOUTH RINSE
15.0000 mL | Freq: Once | OROMUCOSAL | Status: AC
  Administered 2021-01-24: 15 mL via OROMUCOSAL

## 2021-01-24 MED ORDER — BUPIVACAINE-EPINEPHRINE (PF) 0.25% -1:200000 IJ SOLN
INTRAMUSCULAR | Status: AC
  Filled 2021-01-24: qty 30

## 2021-01-24 MED ORDER — KETOROLAC TROMETHAMINE 15 MG/ML IJ SOLN
15.0000 mg | Freq: Four times a day (QID) | INTRAMUSCULAR | Status: DC
  Administered 2021-01-24 – 2021-01-25 (×3): 15 mg via INTRAVENOUS
  Filled 2021-01-24 (×3): qty 1

## 2021-01-24 SURGICAL SUPPLY — 107 items
ADH SKN CLS APL DERMABOND .7 (GAUZE/BANDAGES/DRESSINGS) ×3
APL ESCP 34 STRL LF DISP (HEMOSTASIS)
APL PRP STRL LF DISP 70% ISPRP (MISCELLANEOUS) ×6
APL SRG 38 LTWT LNG FL B (MISCELLANEOUS)
APPLICATOR ARISTA FLEXITIP XL (MISCELLANEOUS) IMPLANT
APPLICATOR SURGIFLO ENDO (HEMOSTASIS) IMPLANT
APPLIER CLIP 5 13 M/L LIGAMAX5 (MISCELLANEOUS)
APPLIER CLIP ROT 10 11.4 M/L (STAPLE)
APR CLP MED LRG 11.4X10 (STAPLE)
APR CLP MED LRG 5 ANG JAW (MISCELLANEOUS)
BAG COUNTER SPONGE SURGICOUNT (BAG) ×4 IMPLANT
BAG LAPAROSCOPIC 12 15 PORT 16 (BASKET) IMPLANT
BAG RETRIEVAL 12/15 (BASKET)
BAG SPEC THK2 15X12 ZIP CLS (MISCELLANEOUS) ×3
BAG SPNG CNTER NS LX DISP (BAG) ×3
BAG ZIPLOCK 12X15 (MISCELLANEOUS) ×4 IMPLANT
BINDER ABDOMINAL 12 ML 46-62 (SOFTGOODS) IMPLANT
BLADE EXTENDED COATED 6.5IN (ELECTRODE) IMPLANT
BLADE SURG SZ10 CARB STEEL (BLADE) IMPLANT
CABLE HIGH FREQUENCY MONO STRZ (ELECTRODE) ×7 IMPLANT
CATH FOLEY 3WAY  5CC 16FR (CATHETERS) ×4
CATH FOLEY 3WAY 5CC 16FR (CATHETERS) ×3 IMPLANT
CHLORAPREP W/TINT 26 (MISCELLANEOUS) ×11 IMPLANT
CLIP APPLIE 5 13 M/L LIGAMAX5 (MISCELLANEOUS) IMPLANT
CLIP APPLIE ROT 10 11.4 M/L (STAPLE) IMPLANT
CLIP VESOLOCK LG 6/CT PURPLE (CLIP) ×4 IMPLANT
CLIP VESOLOCK MED LG 6/CT (CLIP) ×4 IMPLANT
CLIP VESOLOCK XL 6/CT (CLIP) ×4 IMPLANT
CUTTER FLEX LINEAR 45M (STAPLE) IMPLANT
DECANTER SPIKE VIAL GLASS SM (MISCELLANEOUS) ×10 IMPLANT
DEFOGGER SCOPE WARMER CLEARIFY (MISCELLANEOUS) ×4 IMPLANT
DERMABOND ADVANCED (GAUZE/BANDAGES/DRESSINGS) ×1
DERMABOND ADVANCED .7 DNX12 (GAUZE/BANDAGES/DRESSINGS) ×9 IMPLANT
DEVICE SECURE STRAP 25 ABSORB (INSTRUMENTS) ×1 IMPLANT
DEVICE TROCAR PUNCTURE CLOSURE (ENDOMECHANICALS) ×4 IMPLANT
DISSECTOR BLUNT TIP ENDO 5MM (MISCELLANEOUS) IMPLANT
DRAPE INCISE IOBAN 66X45 STRL (DRAPES) ×4 IMPLANT
DRAPE WARM FLUID 44X44 (DRAPES) IMPLANT
ELECT PENCIL ROCKER SW 15FT (MISCELLANEOUS) ×4 IMPLANT
ELECT REM PT RETURN 15FT ADLT (MISCELLANEOUS) ×10 IMPLANT
ENDOLOOP SUT PDS II  0 18 (SUTURE)
ENDOLOOP SUT PDS II 0 18 (SUTURE) IMPLANT
GLOVE SURG ENC MOIS LTX SZ7.5 (GLOVE) ×8 IMPLANT
GLOVE SURG ENC TEXT LTX SZ7.5 (GLOVE) ×4 IMPLANT
GOWN STRL REUS W/TWL LRG LVL3 (GOWN DISPOSABLE) ×9 IMPLANT
GOWN STRL REUS W/TWL XL LVL3 (GOWN DISPOSABLE) ×20 IMPLANT
HEMOSTAT ARISTA ABSORB 3G PWDR (HEMOSTASIS) IMPLANT
HEMOSTAT SURGICEL 4X8 (HEMOSTASIS) ×1 IMPLANT
IRRIG SUCT STRYKERFLOW 2 WTIP (MISCELLANEOUS) ×4
IRRIGATION SUCT STRKRFLW 2 WTP (MISCELLANEOUS) ×3 IMPLANT
KIT BASIN OR (CUSTOM PROCEDURE TRAY) ×10 IMPLANT
KIT TURNOVER KIT A (KITS) ×10 IMPLANT
MANIFOLD NEPTUNE II (INSTRUMENTS) ×4 IMPLANT
MARKER SKIN DUAL TIP RULER LAB (MISCELLANEOUS) ×7 IMPLANT
MESH 3DMAX 5X7 RT XLRG (Mesh General) ×4 IMPLANT
MESH VENTRALIGHT ST 4.5IN (Mesh General) ×1 IMPLANT
NDL INSUFFLATION 14GA 120MM (NEEDLE) ×3 IMPLANT
NDL SPNL 22GX3.5 QUINCKE BK (NEEDLE) IMPLANT
NEEDLE INSUFFLATION 14GA 120MM (NEEDLE) ×4 IMPLANT
NEEDLE SPNL 22GX3.5 QUINCKE BK (NEEDLE) IMPLANT
PAD POSITIONING PINK XL (MISCELLANEOUS) ×4 IMPLANT
PENCIL SMOKE EVACUATOR (MISCELLANEOUS) IMPLANT
PLUG CATH AND CAP STER (CATHETERS) ×4 IMPLANT
POUCH LAPAROSCOPIC INSTRUMENT (MISCELLANEOUS) ×6 IMPLANT
PROTECTOR NERVE ULNAR (MISCELLANEOUS) ×8 IMPLANT
RELOAD 45 VASCULAR/THIN (ENDOMECHANICALS) IMPLANT
RELOAD STAPLE 4.0 BLU F/HERNIA (INSTRUMENTS) ×3 IMPLANT
RELOAD STAPLE 4.8 BLK F/HERNIA (STAPLE) IMPLANT
RELOAD STAPLE 45 2.5 WHT GRN (ENDOMECHANICALS) IMPLANT
RELOAD STAPLE 45 3.5 BLU ETS (ENDOMECHANICALS) IMPLANT
RELOAD STAPLE HERNIA 4.0 BLUE (INSTRUMENTS) ×4 IMPLANT
RELOAD STAPLE HERNIA 4.8 BLK (STAPLE) IMPLANT
RELOAD STAPLE TA45 3.5 REG BLU (ENDOMECHANICALS) IMPLANT
SCISSORS LAP 5X35 DISP (ENDOMECHANICALS) ×4 IMPLANT
SET IRRIG Y TYPE TUR BLADDER L (SET/KITS/TRAYS/PACK) ×4 IMPLANT
SET TUBE SMOKE EVAC HIGH FLOW (TUBING) ×10 IMPLANT
SHEARS HARMONIC ACE PLUS 36CM (ENDOMECHANICALS) ×4 IMPLANT
SLEEVE XCEL OPT CAN 5 100 (ENDOMECHANICALS) ×11 IMPLANT
SPONGE T-LAP 4X18 ~~LOC~~+RFID (SPONGE) ×1 IMPLANT
STAPLER HERNIA 12 8.5 360D (INSTRUMENTS) ×4 IMPLANT
SUT CHROMIC 2 0 SH (SUTURE) ×5 IMPLANT
SUT ETHIBOND 0 MO6 C/R (SUTURE) ×1 IMPLANT
SUT MNCRL AB 4-0 PS2 18 (SUTURE) ×14 IMPLANT
SUT NOVA NAB GS-21 1 T12 (SUTURE) IMPLANT
SUT PDS AB 0 CT1 36 (SUTURE) ×4 IMPLANT
SUT PDS AB 1 CT1 27 (SUTURE) IMPLANT
SUT PROLENE 2 0 KS (SUTURE) ×4 IMPLANT
SUT PROLENE 4 0 P 3 18 (SUTURE) ×1 IMPLANT
SUT VIC AB 1 CT1 27 (SUTURE)
SUT VIC AB 1 CT1 27XBRD ANBCTR (SUTURE) IMPLANT
SUT VIC AB 2-0 CT1 27 (SUTURE)
SUT VIC AB 2-0 CT1 27XBRD (SUTURE) IMPLANT
SUT VICRYL 0 UR6 27IN ABS (SUTURE) ×5 IMPLANT
SYR TOOMEY IRRIG 70ML (MISCELLANEOUS)
SYRINGE TOOMEY IRRIG 70ML (MISCELLANEOUS) ×3 IMPLANT
TAPE CLOTH 4X10 WHT NS (GAUZE/BANDAGES/DRESSINGS) ×1 IMPLANT
TOWEL OR 17X26 10 PK STRL BLUE (TOWEL DISPOSABLE) ×10 IMPLANT
TOWEL OR NON WOVEN STRL DISP B (DISPOSABLE) ×10 IMPLANT
TRAY FOLEY MTR SLVR 14FR STAT (SET/KITS/TRAYS/PACK) ×4 IMPLANT
TRAY FOLEY MTR SLVR 16FR STAT (SET/KITS/TRAYS/PACK) ×4 IMPLANT
TRAY LAPAROSCOPIC (CUSTOM PROCEDURE TRAY) ×10 IMPLANT
TROCAR BLADELESS OPT 5 100 (ENDOMECHANICALS) ×4 IMPLANT
TROCAR OPTICAL SHORT 5MM (TROCAR) ×1 IMPLANT
TROCAR OPTICAL SLV SHORT 5MM (TROCAR) ×4 IMPLANT
TROCAR UNIVERSAL OPT 12M 100M (ENDOMECHANICALS) ×4 IMPLANT
TROCAR XCEL 12X100 BLDLESS (ENDOMECHANICALS) ×7 IMPLANT
WARMER LAPAROSCOPE (MISCELLANEOUS) ×3 IMPLANT

## 2021-01-24 NOTE — Op Note (Signed)
01/24/2021  11:41 AM  PATIENT:  Andrew Coffey  70 y.o. male  PRE-OPERATIVE DIAGNOSIS:  LEFT RENAL CYST  POST-OPERATIVE DIAGNOSIS:   RIGHT INGUINAL HERNIA, VENTRAL HERNIA  PROCEDURE:  Procedure(s):  LAPAROSCOPIC VENTRAL HERNIA (N/A) LAPAROSCOPIC RIGHT INGUINAL HERNIA REPAIR WITH MESH (Right)  SURGEON:  Surgeon(s) and Role:      Ralene Ok, MD - Primary  ASSISTANTS: Otho Bellows, PGY-3   ANESTHESIA:   local and general  EBL:  <5cc   BLOOD ADMINISTERED:none  DRAINS: none   LOCAL MEDICATIONS USED:  BUPIVICAINE   SPECIMEN:  No Specimen  DISPOSITION OF SPECIMEN:  N/A  COUNTS:  YES  TOURNIQUET:  * No tourniquets in log *  DICTATION: .Dragon Dictation Counts: reported as correct x 2  Findings:  The patient had a large right indirect hernia and a moderated sized cord lipoma  Indications for procedure:  The patient is a 70 year old male with a right inguinal hernia for several weeks and a ventral hernia. Patient complained of symptomatology to his right inguinal and ventral hernia areas. The patient was taken back for elective inguinal hernia repair combined with Dr. Louis Meckel.  Details of the procedure:The patient was in the OR with Dr. Louis Meckel and proceeded with part of the surgery after he was in the OR.  He will dictate this under separate cover.  The patient was repositioned. The patient was prepped and draped in the usual sterile fashion.  After appropriate anitbiotics were confirmed, a time-out was confirmed and all facts were verified.  0.25% Marcaine was used to infiltrate the umbilical area. A 11-blade was used to cut down the skin and blunt dissection was used to get the anterior fashion.  The anterior fascia was incised approximately 1 cm and the muscles were retracted laterally. Blunt dissection was then used to create a space in the preperitoneal area. At this time a 10 mm camera was then introduced into the space and advanced the pubic tubercle and a 12  mm trocar was placed over this and insufflation was started.  At this time and space was created from medial to laterally the preperitoneal space.  Cooper's ligament was initially cleaned off.  The hernia sac was identified in the indirect space. Dissection of the hernia sac and cord structures was undertaken the vas deferens was identified and protected in all parts of the case.  There was a moderate cord lipoma that was dissected out.  Once the hernia sac was taken down to approximately the umbilicus a Bard 3D Max mesh, size: Rachelle Hora, was  introduced into the preperitoneal space.  The mesh was brought over to cover the direct and indirect hernia spaces.  This was anchored into place and secured to Cooper's ligament with 4.10mm staples from a [place] hernia stapler. It was anchored to the anterior abdominal wall with 4.8 mm staples. The hernia sac was seen lying posterior to the mesh. There was no staples placed laterally. The insufflation was evacuated and the peritoneum was seen posterior to the mesh. The trochars were removed. The anterior fascia was reapproximated using #1 Vicryl on a UR- 6.  Intra-abdominal air was evacuated and the Veress needle removed. The skin was reapproximated using 4-0 Monocryl subcuticular fashion and Dermabond.   At this time we turned our attention to the ventral hernia.  The previous trocars were used from the urologic case at the left subcostal margin and left lower quadrant. The abdomen was insufflated to 14 mm mercury.    There was seen  to be an non-incarcerated  1cm umbilical hernia.  A second camera port was in placed into the left lower quadrant.     I proceeded to reduce the hernia contents.  The hernia sac was dissected out of the hernia and disposed.  The fascia at the hernia was reapproximated using a #0 Ethibonds x 4.  Once the hernia was cleared away, a Bard Ventralight 11.4cm  mesh was inserted into the abdomen.  The mesh was secured circumferentially with am  Securestrap tacker in a double crown fashion.    The omentum was brought over the area of the mesh. The pneumoperitoneum was evacuated  & all trocars  were removed. The skin was reapproximated with 4-0  Monocryl sutures in a subcuticular fashion. The skin was dressed with Dermabond.  The patient was taken to the recovery room in stable condition.  Type of repair -primary suture & mesh  Mesh overlap - 5cm  Placement of mesh -  beneath fascia and into peritoneal cavity  The patient was awakened from general anesthesia and taken to recovery in stable condition.   I was personally present during the key and critical portions of this procedure and immediately available throughout the entire procedure, as documented in my operative note.   PLAN OF CARE: Per Dr. Louis Meckel  PATIENT DISPOSITION:  PACU - hemodynamically stable.   Delay start of Pharmacological VTE agent (>24hrs) due to surgical blood loss or risk of bleeding: not applicable

## 2021-01-24 NOTE — Transfer of Care (Signed)
Immediate Anesthesia Transfer of Care Note  Patient: Andrew Coffey  Procedure(s) Performed: Procedure(s) (LRB): LAPAROSCOPIC LEFT RENAL CYST MARSUPIALIZATION (Left) LAPAROSCOPIC VENTRAL HERNIA (N/A) LAPAROSCOPIC RIGHT INGUINAL AND UMBILICAL HERNIA REPAIR WITH MESH (Right)  Patient Location: PACU  Anesthesia Type: General  Level of Consciousness: awake, sedated, patient cooperative and responds to stimulation  Airway & Oxygen Therapy: Patient Spontanous Breathing and Patient connected to FM 02 and soft FM   Post-op Assessment: Report given to PACU RN, Post -op Vital signs reviewed and stable and Patient moving all extremities  Post vital signs: Reviewed and stable  Complications: No apparent anesthesia complications

## 2021-01-24 NOTE — Interval H&P Note (Signed)
History and Physical Interval Note:  01/24/2021 7:33 AM  Andrew Coffey  has presented today for surgery, with the diagnosis of LEFT RENAL CYST.  The various methods of treatment have been discussed with the patient and family. After consideration of risks, benefits and other options for treatment, the patient has consented to  Procedure(s): LAPAROSCOPIC PARTIAL NEPHRECTOMY (Left) LAPAROSCOPIC VENTRAL HERNIA (N/A) LAPAROSCOPIC RIGHT INGUINAL HERNIA REPAIR WITH MESH (Right) as a surgical intervention.  The patient's history has been reviewed, patient examined, no change in status, stable for surgery.  I have reviewed the patient's chart and labs.  Questions were answered to the patient's satisfaction.     Ardis Hughs

## 2021-01-24 NOTE — Op Note (Signed)
Preoperative diagnosis:  Left renal cyst  Postoperative diagnosis:  same   Procedure: Laparoscopic left renal cyst marsupialization  Surgeon: Ardis Hughs, MD Resident Assistant: Charlcie Cradle  Anesthesia: General  Complications: None  Intraoperative findings:  #1: Large 11 cm simple cyst at the renal hilum very central,  750 cc. #2: Venous bleeding from one of the branches of the renal vein, controlled with 4-0 Prolene.  EBL: 22mL  Specimens:  Right kidney and proximal ureter  Indication: Andrew Coffey is a 70 y.o. patient with large symptomatic left renal cyst.  After reviewing the management options for treatment, he elected to proceed with the above surgical procedure(s). We have discussed the potential benefits and risks of the procedure, side effects of the proposed treatment, the likelihood of the patient achieving the goals of the procedure, and any potential problems that might occur during the procedure or recuperation. Informed consent has been obtained.  Description of procedure:  A site was selected lateral to the umbilicus for placement of the camera port. This was placed using a standard open Hassan technique which allowed entry into the peritoneal cavity under direct vision and without difficulty. A 12 mm Hassan cannula was placed and a pneumoperitoneum established. The camera was then used to inspect the abdomen and there was no evidence of any intra-abdominal injuries or other abnormalities. The remaining abdominal ports were then placed under visual guidance.  A second 5 mm port was placed in the right lower quadrant approximately 8 cm away from the camera. A 5 mm port was placed in the right upper quadrant again 8 cm away from the camera. All ports were placed under direct vision without difficulty.   The white line of Toldt was incised allowing the colon to be mobilized medially and the plane between the mesocolon and the anterior layer of Gerota's  fascia to be developed and the kidney exposed.  The cyst was readily apparent centrally.  The colon was reflected down off the cyst and the cyst was entered and drained.  We then used the LigaSure to remove the edges of the cyst.  We then fulgurated the cut edges as well as the base.  At this point there was some bleeding from one of the branches of the renal vein.  I used an extra 5 mm port lateral to the camera site, and using of 4-0 Prolene this was sutured with a figure-of-eight and tied.  Hemostasis was noted to be adequate.  Some Surgicel was placed over the area.  At this point, the case was turned over to Dr. Rosendo Gros for his portion of the case repairing hernias.  The port sites were Tegaderm and the patient was repositioned and Dr. Emogene Morgan  used these ports for his portion of the surgery.    Ardis Hughs, M.D.

## 2021-01-24 NOTE — H&P (Signed)
CC/HPI: 70 year old male who presents today for follow-up. I saw him approximately 2 weeks ago and he was noted to have microscopic hematuria at the time of our evaluation. We perform cystoscopy on that day and that demonstrated a largely unremarkable bladder with a TURP defect and a small prosthetic adhesion to the verumontanum. In addition, he also had an ultrasound that demonstrated a very large left mid pole anterior cyst measuring approximately 11 cm. He also has a large stone in the left lower pole.   Since the patient was seen, he was also evaluated by his primary care doctor who found a reducible, but enlarging and sizable umbilical hernia.   The patient relates that he does have some intermittent left upper quadrant pain. This pain seems to be present in the mornings mostly. The pain is tolerable but has been present now for a couple years intermittently. It seems to be becoming more often. He denies any history of constipation. He denies any fevers or chills. He denies early satiety. He denies nausea.     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 LUTS - 09/03/2020, - 2018 (Stable), - 2017, Enlarged prostate without lower urinary tract symptoms (luts), - 2016 Encounter for Prostate Cancer screening - 09/03/2020, - 06/15/2018 Microscopic hematuria - 09/03/2020 Renal cyst - 09/03/2020 ED due to arterial insufficiency - 2018 Other microscopic hematuria, Microscopic hematuria - 2016 Renal calculus, Nephrolithiasis - 2016 Urinary Retention, Unspec, Urinary retention - 2016 Hydronephrosis Unspec, Hydronephrosis, 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, Bladder diverticulum -  2016 Kidney Failure, acute, Unspec, Acute kidney failure - 2016    NON-GU PMH: Encounter for general adult medical examination without abnormal findings, Encounter for preventive health examination - 2015 Personal history of other specified conditions, History of heartburn - 2014    FAMILY HISTORY: Acute Myocardial Infarction - Father Family Health Status Number - Runs In Family Family Health Status Of Father - Deceased - Grandfather Family Health Status Of Mother - Deceased - Grandfather Tuberculosis - Grandfather   SOCIAL HISTORY: Marital Status: Married Preferred Language: English; Race: White Current Smoking Status: Patient has never smoked.  Drinks 3 drinks per week. Light Drinker.  Drinks 3 caffeinated drinks per day. 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    REVIEW OF SYSTEMS:    GU Review Male:   Patient denies frequent urination, hard to postpone urination, burning/ pain with urination, get up at night to urinate, leakage of urine, stream starts and stops, trouble starting your stream, have to strain to urinate , erection problems, and penile pain.  Gastrointestinal (Upper):   Patient denies nausea, vomiting, and indigestion/ heartburn.  Gastrointestinal (Lower):   Patient denies diarrhea and constipation.  Constitutional:   Patient denies fever, night sweats, weight loss, and fatigue.  Skin:   Patient denies skin rash/ lesion and itching.  Eyes:   Patient denies blurred vision and double vision.  Ears/ Nose/ Throat:   Patient denies sore throat and sinus problems.  Hematologic/Lymphatic:   Patient denies swollen glands and easy bruising.  Cardiovascular:   Patient denies leg swelling and chest pains.  Respiratory:   Patient denies cough and shortness of breath.  Endocrine:   Patient denies excessive thirst.  Musculoskeletal:   Patient denies back pain and joint pain.  Neurological:   Patient denies headaches and dizziness.  Psychologic:   Patient denies depression  and anxiety.   VITAL SIGNS:      09/21/2020 01:32 PM  Weight 173 lb / 78.47 kg  BP 148/91 mmHg  Pulse 57 /min   GU PHYSICAL EXAMINATION:    Anus and Perineum: No hemorrhoids. No anal stenosis. No rectal fissure, no anal fissure. No edema, no dimple, no perineal tenderness, no anal tenderness.  Scrotum: No lesions. No edema. No cysts. No warts.  Epididymides: Right: no spermatocele, no masses, no cysts, no tenderness, no induration, no enlargement. Left: no spermatocele, no masses, no cysts, no tenderness, no induration, no enlargement.  Testes: No tenderness, no swelling, no enlargement left testes. No tenderness, no swelling, no enlargement right testes. Normal location left testes. Normal location right testes. No mass, no cyst, no varicocele, no hydrocele left testes. No mass, no cyst, no varicocele, no hydrocele right testes.  Urethral Meatus: Normal size. No lesion, no wart, no discharge, no polyp. Normal location.  Penis: Circumcised, no warts, no cracks. No dorsal Peyronie's plaques, no left corporal Peyronie's plaques, no right corporal Peyronie's plaques, no scarring, no warts. No balanitis, no meatal stenosis.  Prostate: 40 gram or 2+ size. Left lobe normal consistency, right lobe normal consistency. Symmetrical lobes. No prostate nodule. Left lobe no tenderness, right lobe no tenderness.  Seminal Vesicles: Nonpalpable.  Sphincter Tone: Normal sphincter. No rectal tenderness. No rectal mass.    MULTI-SYSTEM PHYSICAL EXAMINATION:    Constitutional: Well-nourished. No physical deformities. Normally developed. Good grooming.  Neck: Neck symmetrical, not swollen. Normal tracheal position.  Respiratory: Normal breath sounds. No labored breathing, no use of accessory muscles.   Cardiovascular: Regular rate and rhythm. No murmur, no gallop. Normal temperature, normal extremity pulses, no swelling, no varicosities.   Lymphatic: No enlargement of neck, axillae, groin.  Skin: No paleness, no  jaundice, no cyanosis. No lesion, no ulcer, no rash.  Neurologic / Psychiatric: Oriented to time, oriented to place, oriented to person. No depression, no anxiety, no agitation.  Gastrointestinal: Umbilical hernia, reducible, approximately 2 cm defect. No mass, no tenderness, no rigidity, non obese abdomen.   Eyes: Normal conjunctivae. Normal eyelids.  Ears, Nose, Mouth, and Throat: Left ear no scars, no lesions, no masses. Right ear no scars, no lesions, no masses. Nose no scars, no lesions, no masses. Normal hearing. Normal lips.  Musculoskeletal: Normal gait and station of head and neck.     Complexity of Data:  Source Of History:  Patient  Records Review:   Previous Doctor Records, Previous Patient Records  Urine Test Review:   Urinalysis  X-Ray Review: Renal Ultrasound: Reviewed Films. Discussed With Patient.     09/03/20 06/09/18 05/26/17 06/02/16 08/01/14 07/26/13 08/03/12 07/22/11  PSA  Total PSA 3.26 ng/mL 2.73 ng/mL 2.48 ng/mL 2.25 ng/dl 2.54  3.21  2.32  1.85   Free PSA     0.50      % Free PSA     20        PROCEDURES:          Urinalysis w/Scope Dipstick Dipstick Cont'd Micro  Color: Yellow Bilirubin: Neg mg/dL WBC/hpf: 0 - 5/hpf  Appearance: Slightly Cloudy Ketones: Neg mg/dL RBC/hpf: 40 - 60/hpf  Specific Gravity: 1.020 Blood: 3+ ery/uL Bacteria: Rare (0-9/hpf)  pH: 6.0 Protein: Trace mg/dL Cystals: NS (Not Seen)  Glucose:  Neg mg/dL Urobilinogen: 0.2 mg/dL Casts: NS (Not Seen)    Nitrites: Neg Trichomonas: Not Present    Leukocyte Esterase: Trace leu/uL Mucous: Not Present      Epithelial Cells: NS (Not Seen)      Yeast: NS (Not Seen)      Sperm: Not Present    ASSESSMENT:      ICD-10 Details  1 GU:   Microscopic hematuria - R31.1   2   Renal cyst - N28.1   3 NON-GU:   Umbilical hernia without obstruction or gangrene - K42.9    PLAN:            Medications New Meds: Sildenafil Citrate 20 mg tablet 2-5 tablets as needed for intimacy   #50  0 Refill(s)             Document Letter(s):  Created for Patient: Clinical Summary         Notes:   S the patient and I discussed the a large simple cyst he has on his ultrasound measures approximately 11 cm. This is very anteriorly located. I told him that it is a strong possibility that the symptoms he is having of discomfort are associated with the cyst itself. We discussed management strategies and 1 option we discussed was aspirating the cyst and seeing if his symptoms persisted. If symptoms resolved then when his cyst came back we could easily marsupialization it.   I alternative strategy would be to try to can by an procedures with General surgery since he is interested in having his ventral hernia repaired. We discussed going straight to a laparoscopic renal cyst marsupialization in combination with a ventral hernia repair by did General surgery. I think if this was possible from general surgeries perspective we could certainly make it work with 1 anesthesia. I discussed with the patient the risk of persistent pain despite getting the cyst. We also discussed the associated complications of injuries to the surrounding structures. I do not think this will compromise any his kidney function has small risk of bleeding. I think that the patient should be able to be discharged home on the same day unless there is objection from general surgery. I will make a referral to Dr. Rosendo Gros to discuss potential of having him the kidneys done at same time and try to coordinate this scheduling.   In addition, we discussed the patient's kidney stone. I suggested that we follow this with annual KUB and if it looks as if it is growing quickly that we intervene sooner rather than later. The patient was okay with this plan. For now, we will manage it conservatively with increased fluid intake.   The patient I also discussed his erectile dysfunction. The patient is interested in trialing sildenafil. I recommended that the patient  use 20 mg tablets and start with 1 pill and work up to 5 as needed.   I reviewed the proper use and potential side effects of sildenafil. We also discussed that generic sildenafil 20 mg for treatment of erectile dysfunction is considered "off label" use of this medication. All questions were answered to the patient's satisfaction and he was provided medication today, #90 tablets of20mg  sildenafil.

## 2021-01-24 NOTE — OR Nursing (Signed)
Dr Rosendo Gros attested on phone- computer with patient and RNs, pt denies questions all agree ok to go to OR

## 2021-01-24 NOTE — Anesthesia Postprocedure Evaluation (Signed)
Anesthesia Post Note  Patient: Andrew Coffey  Procedure(s) Performed: LAPAROSCOPIC LEFT RENAL CYST MARSUPIALIZATION (Left: Flank) LAPAROSCOPIC VENTRAL HERNIA LAPAROSCOPIC RIGHT INGUINAL AND UMBILICAL HERNIA REPAIR WITH MESH (Right: Abdomen)     Patient location during evaluation: PACU Anesthesia Type: General Level of consciousness: awake and alert Pain management: pain level controlled Vital Signs Assessment: post-procedure vital signs reviewed and stable Respiratory status: spontaneous breathing, nonlabored ventilation, respiratory function stable and patient connected to nasal cannula oxygen Cardiovascular status: blood pressure returned to baseline and stable Postop Assessment: no apparent nausea or vomiting Anesthetic complications: no   No notable events documented.  Last Vitals:  Vitals:   01/24/21 1400 01/24/21 1424  BP: 118/74 122/68  Pulse: 86 91  Resp: 15 16  Temp:  36.6 C  SpO2: 99% 100%    Last Pain:  Vitals:   01/24/21 1424  TempSrc: Oral  PainSc:                  Latoya Maulding L Ilda Laskin

## 2021-01-24 NOTE — Anesthesia Procedure Notes (Signed)
Procedure Name: Intubation Date/Time: 01/24/2021 7:51 AM Performed by: Justice Rocher, CRNA Pre-anesthesia Checklist: Patient identified, Emergency Drugs available, Suction available, Patient being monitored and Timeout performed Patient Re-evaluated:Patient Re-evaluated prior to induction Oxygen Delivery Method: Circle system utilized Preoxygenation: Pre-oxygenation with 100% oxygen Induction Type: IV induction Ventilation: Mask ventilation without difficulty Tube type: Oral Tube size: 7.5 mm Number of attempts: 1 Airway Equipment and Method: Stylet and Oral airway Placement Confirmation: ETT inserted through vocal cords under direct vision, positive ETCO2, breath sounds checked- equal and bilateral and CO2 detector Secured at: 23 cm Tube secured with: Tape Dental Injury: Teeth and Oropharynx as per pre-operative assessment

## 2021-01-24 NOTE — Discharge Instructions (Addendum)
Activity:  You are encouraged to ambulate frequently (about every hour during waking hours) to help prevent blood clots from forming in your legs or lungs.  However, you should not engage in any heavy lifting (> 10-15 lbs), strenuous activity, or straining.  Diet: You should advance your diet as instructed by your physician.  It will be normal to have some bloating, nausea, and abdominal discomfort intermittently.  Prescriptions:  You will be provided a prescription for pain medication to take as needed.  If your pain is not severe enough to require the prescription pain medication, you may take extra strength Tylenol instead which will have less side effects.  You should also take a prescribed stool softener to avoid straining with bowel movements as the prescription pain medication may constipate you.  Incisions: You may remove your dressing bandages 48 hours after surgery if not removed in the hospital.  You will either have some small staples or special tissue glue at each of the incision sites. Once the bandages are removed (if present), the incisions may stay open to air.  You may start showering (but not soaking or bathing in water) the 2nd day after surgery and the incisions simply need to be patted dry after the shower.  No additional care is needed.  What to call us about: You should call the office [phone]) if you develop fever > 101 or develop persistent vomiting. Activity:  You are encouraged to ambulate frequently (about every hour during waking hours) to help prevent blood clots from forming in your legs or lungs.  However, you should not engage in any heavy lifting (> 10-15 lbs), strenuous activity, or straining.     CCS _______[facility]  HERNIA REPAIR: POST OP INSTRUCTIONS  Always review your discharge instruction sheet given to you by the facility where your surgery was performed. IF YOU HAVE DISABILITY OR FAMILY LEAVE FORMS, YOU MUST BRING THEM TO THE  OFFICE FOR PROCESSING.   DO NOT GIVE THEM TO YOUR DOCTOR.  1. A  prescription for pain medication may be given to you upon discharge.  Take your pain medication as prescribed, if needed.  If narcotic pain medicine is not needed, then you may take acetaminophen (Tylenol) or ibuprofen (Advil) as needed. 2. Take your usually prescribed medications unless otherwise directed. If you need a refill on your pain medication, please contact your pharmacy.  They will contact our office to request authorization. Prescriptions will not be filled after 5 pm or on week-ends. 3. You should follow a light diet the first 24 hours after arrival home, such as soup and crackers, etc.  Be sure to include lots of fluids daily.  Resume your normal diet the day after surgery. 4.Most patients will experience some swelling and bruising around the umbilicus or in the groin and scrotum.  Ice packs and reclining will help.  Swelling and bruising can take several days to resolve.  6. It is common to experience some constipation if taking pain medication after surgery.  Increasing fluid intake and taking a stool softener (such as Colace) will usually help or prevent this problem from occurring.  A mild laxative (Milk of Magnesia or Miralax) should be taken according to package directions if there are no bowel movements after 48 hours. 7. Unless discharge instructions indicate otherwise, you may remove your bandages 24-48 hours after surgery, and you may shower at that time.  You may have steri-strips (small skin tapes) in place directly over the incision.  These strips should be left on the skin for 7-10 days.  If your surgeon used skin glue on the incision, you may shower in 24 hours.  The glue will flake off over the next 2-3 weeks.  Any sutures or staples will be removed at the office during your follow-up visit. 8. ACTIVITIES:  You may resume regular (light) daily activities beginning the next day--such as daily self-care, walking,  climbing stairs--gradually increasing activities as tolerated.  You may have sexual intercourse when it is comfortable.  Refrain from any heavy lifting or straining until approved by your doctor.  a.You may drive when you are no longer taking prescription pain medication, you can comfortably wear a seatbelt, and you can safely maneuver your car and apply brakes. b.RETURN TO WORK:   _____________________________________________  9.You should see your doctor in the office for a follow-up appointment approximately 2-3 weeks after your surgery.  Make sure that you call for this appointment within a day or two after you arrive home to insure a convenient appointment time. 10.OTHER INSTRUCTIONS: _________________________    _____________________________________  WHEN TO CALL YOUR DOCTOR: Fever over 101.0 Inability to urinate Nausea and/or vomiting Extreme swelling or bruising Continued bleeding from incision. Increased pain, redness, or drainage from the incision  The clinic staff is available to answer your questions during regular business hours.  Please don't hesitate to call and ask to speak to one of the nurses for clinical concerns.  If you have a medical emergency, go to the nearest emergency room or call 911.  A surgeon from [facility] is always on call at the hospital   [address] [place], [place], [place]  [address] ?  [address], [place], [place]   [id number] [phone] ? [phone] ? FAX [phone] Web site: [email or web address]

## 2021-01-25 ENCOUNTER — Encounter (HOSPITAL_COMMUNITY): Payer: Self-pay | Admitting: Urology

## 2021-01-25 DIAGNOSIS — K439 Ventral hernia without obstruction or gangrene: Secondary | ICD-10-CM | POA: Diagnosis not present

## 2021-01-25 DIAGNOSIS — K4090 Unilateral inguinal hernia, without obstruction or gangrene, not specified as recurrent: Secondary | ICD-10-CM | POA: Diagnosis not present

## 2021-01-25 DIAGNOSIS — N281 Cyst of kidney, acquired: Secondary | ICD-10-CM | POA: Diagnosis not present

## 2021-01-25 DIAGNOSIS — R311 Benign essential microscopic hematuria: Secondary | ICD-10-CM | POA: Diagnosis not present

## 2021-01-25 LAB — BASIC METABOLIC PANEL
Anion gap: 6 (ref 5–15)
BUN: 14 mg/dL (ref 8–23)
CO2: 30 mmol/L (ref 22–32)
Calcium: 8.5 mg/dL — ABNORMAL LOW (ref 8.9–10.3)
Chloride: 102 mmol/L (ref 98–111)
Creatinine, Ser: 0.97 mg/dL (ref 0.61–1.24)
GFR, Estimated: >60 mL/min (ref >60)
Glucose, Bld: 138 mg/dL — ABNORMAL HIGH (ref 70–99)
Potassium: 3.6 mmol/L (ref 3.5–5.1)
Sodium: 138 mmol/L (ref 135–145)

## 2021-01-25 LAB — CBC
HCT: 30.8 % — ABNORMAL LOW (ref 39.0–52.0)
Hemoglobin: 9.8 g/dL — ABNORMAL LOW (ref 13.0–17.0)
MCH: 29.8 pg (ref 26.0–34.0)
MCHC: 31.8 g/dL (ref 30.0–36.0)
MCV: 93.6 fL (ref 80.0–100.0)
Platelets: 175 10*3/uL (ref 150–400)
RBC: 3.29 MIL/uL — ABNORMAL LOW (ref 4.22–5.81)
RDW: 14.5 % (ref 11.5–15.5)
WBC: 10 10*3/uL (ref 4.0–10.5)
nRBC: 0 % (ref 0.0–0.2)

## 2021-01-25 NOTE — Progress Notes (Signed)
Urology Progress Note   1 Day Post-Op from laparoscopic left renal cyst decortication, right inguinal hernia repair, ventral hernia repair joint case with urology and general surgery.   Subjective: NAEON.  Pain well controlled this morning.  Patient tolerating clear liquid diet.  Vital signs are stable.  Hemoglobin is 9.8 from 11 postoperatively.  Creatinine is stable.  Made about 1 L of urine with about 500 cc overnight.  Objective: Vital signs in last 24 hours: Temp:  [97.5 F (36.4 C)-98.4 F (36.9 C)] 98.1 F (36.7 C) (07/15 0527) Pulse Rate:  [63-92] 63 (07/15 0527) Resp:  [6-18] 18 (07/15 0527) BP: (98-134)/(59-79) 101/64 (07/15 0527) SpO2:  [97 %-100 %] 100 % (07/15 0527) Weight:  [75.8 kg] 75.8 kg (07/14 1435)  Intake/Output from previous day: 07/14 0701 - 07/15 0700 In: 5225 [P.O.:800; I.V.:3175; IV Piggyback:1250] Out: 1920 [Urine:1020; Blood:300] Intake/Output this shift: No intake/output data recorded.  Physical Exam:  General: Alert and oriented CV: Regular rate Lungs: No increased work of breathing Abdomen: Soft, appropriately tender. Incisions c/d/i. JP SS GU: Voiding spontaneously Ext: NT, No erythema  Lab Results: Recent Labs    01/24/21 1234 01/25/21 0421  HGB 11.0* 9.8*  HCT 34.5* 30.8*   Recent Labs    01/25/21 0421  NA 138  K 3.6  CL 102  CO2 30  GLUCOSE 138*  BUN 14  CREATININE 0.97  CALCIUM 8.5*    Studies/Results: No results found.  Assessment/Plan:  70 y.o. male s/p laparoscopic left renal cyst decortication, right inguinal hernia repair, ventral hernia repair joint case with urology and general surgery.  Overall doing well post-op.   -Discontinue IV fluids -Ambulate in the hallways -As needed p.o. analgesics -Monitor vitals and urine output.  Dispo: Potentially home today   LOS: 0 days

## 2021-01-25 NOTE — Discharge Summary (Signed)
[facility] Discharge Summary  Admit date: 01/24/2021  Discharge date and time: 01/25/21   Discharge to: Home  Discharge Service: Urology  Discharge Attending Physician:  Burman Nieves, MD  Discharge  Diagnoses: Left renal cyst, ventral ernia, right inguinal hernia  Secondary Diagnosis: Active Problems:   Renal cyst   OR Procedures: Procedure(s): LAPAROSCOPIC LEFT RENAL CYST MARSUPIALIZATION LAPAROSCOPIC VENTRAL HERNIA LAPAROSCOPIC RIGHT INGUINAL AND UMBILICAL HERNIA REPAIR WITH MESH 01/24/2021   Ancillary Procedures: None   Discharge Day Services: The patient was seen and examined by the Urology team both in the morning and immediately prior to discharge.  Vital signs and laboratory values were stable and within normal limits.  The physical exam was benign and unchanged and all surgical wounds were examined.  Discharge instructions were explained and all questions answered.  Subjective  No acute events overnight. Pain Controlled. No fever or chills.  Objective Patient Vitals for the past 8 hrs:  BP Temp Temp src Pulse Resp SpO2  01/25/21 0527 101/64 98.1 F (36.7 C) Oral 63 18 100 %  01/25/21 0246 (!) 98/59 97.7 F (36.5 C) Oral 64 18 100 %   No intake/output data recorded.  General Appearance:        No acute distress Lungs:                       Normal work of breathing on room air Heart:                                Regular rate and rhythm Abdomen:                         Soft, non-tender, non-distended Extremities:                      Warm and well perfused   Hospital Course:  The patient underwent laparoscopic left renal cyst decortication, right inguinal hernia repair, ventral hernia repair joint case with urology and general surgery on 01/24/2021.  The patient tolerated the procedure well, was extubated in the OR, and afterwards was taken to the PACU for routine post-surgical care. When stable the patient was transferred to the floor.   The patient did well  postoperatively.  The patient's diet was slowly advanced and at the time of discharge was tolerating a regular diet.  Hemoglobin 9.8 on POD1. The patient was discharged home 1 Day Post-Op, at which point was tolerating a regular solid diet, was able to void spontaneously, have adequate pain control with P.O. pain medication, and could ambulate without difficulty. The patient will follow up with [place] for post op check.   Condition at Discharge: Improved  Discharge Medications:  Allergies as of 01/25/2021       Reactions   Advil Cold & Sinus Liqui-gels [pseudoephedrine-ibuprofen]    Unrinary retention         Medication List     STOP taking these medications    HYDROcodone-acetaminophen 5-325 MG tablet Commonly known as: NORCO/VICODIN   ketorolac 10 MG tablet Commonly known as: TORADOL       TAKE these medications    latanoprost 0.005 % ophthalmic solution Commonly known as: XALATAN Place 1 drop into both eyes at bedtime.   multivitamin with minerals Tabs tablet Take 1 tablet by mouth daily.   traMADol 50 MG tablet Commonly known as: ULTRAM Take 1-2  tablets (50-100 mg total) by mouth every 6 (six) hours as needed for moderate pain.   VITAMIN D PO Take 1 capsule by mouth daily.

## 2021-02-01 DIAGNOSIS — Z85828 Personal history of other malignant neoplasm of skin: Secondary | ICD-10-CM | POA: Diagnosis not present

## 2021-02-01 DIAGNOSIS — L111 Transient acantholytic dermatosis [Grover]: Secondary | ICD-10-CM | POA: Diagnosis not present

## 2021-02-01 DIAGNOSIS — B351 Tinea unguium: Secondary | ICD-10-CM | POA: Diagnosis not present

## 2021-02-01 DIAGNOSIS — L570 Actinic keratosis: Secondary | ICD-10-CM | POA: Diagnosis not present

## 2021-02-01 DIAGNOSIS — L821 Other seborrheic keratosis: Secondary | ICD-10-CM | POA: Diagnosis not present

## 2021-02-05 DIAGNOSIS — H401122 Primary open-angle glaucoma, left eye, moderate stage: Secondary | ICD-10-CM | POA: Diagnosis not present

## 2021-02-05 DIAGNOSIS — H401111 Primary open-angle glaucoma, right eye, mild stage: Secondary | ICD-10-CM | POA: Diagnosis not present

## 2021-02-07 DIAGNOSIS — R8271 Bacteriuria: Secondary | ICD-10-CM | POA: Diagnosis not present

## 2021-02-07 DIAGNOSIS — N281 Cyst of kidney, acquired: Secondary | ICD-10-CM | POA: Diagnosis not present

## 2021-02-14 DIAGNOSIS — D649 Anemia, unspecified: Secondary | ICD-10-CM | POA: Diagnosis not present

## 2021-04-10 ENCOUNTER — Other Ambulatory Visit (HOSPITAL_BASED_OUTPATIENT_CLINIC_OR_DEPARTMENT_OTHER): Payer: Self-pay

## 2021-04-10 ENCOUNTER — Ambulatory Visit: Payer: BC Managed Care – PPO | Attending: Internal Medicine

## 2021-04-10 DIAGNOSIS — Z23 Encounter for immunization: Secondary | ICD-10-CM

## 2021-04-10 MED ORDER — PFIZER COVID-19 VAC BIVALENT 30 MCG/0.3ML IM SUSP
INTRAMUSCULAR | 0 refills | Status: DC
  Filled 2021-04-10: qty 0.3, 1d supply, fill #0

## 2021-04-10 NOTE — Progress Notes (Signed)
   Covid-19 Vaccination Clinic  Name:  ROSHAN ROBACK    MRN: [id number] DOB: Mar 30, 1951  04/10/2021  Mr. Furio was observed post Covid-19 immunization for 15 minutes without incident. He was provided with Vaccine Information Sheet and instruction to access the V-Safe system.   Mr. Geier was instructed to call 911 with any severe reactions post vaccine: Difficulty breathing  Swelling of face and throat  A fast heartbeat  A bad rash all over body  Dizziness and weakness

## 2021-04-12 DIAGNOSIS — Z23 Encounter for immunization: Secondary | ICD-10-CM | POA: Diagnosis not present

## 2021-05-29 DIAGNOSIS — Z713 Dietary counseling and surveillance: Secondary | ICD-10-CM | POA: Diagnosis not present

## 2021-05-30 DIAGNOSIS — Z713 Dietary counseling and surveillance: Secondary | ICD-10-CM | POA: Diagnosis not present

## 2021-06-15 DIAGNOSIS — Z713 Dietary counseling and surveillance: Secondary | ICD-10-CM | POA: Diagnosis not present

## 2021-06-17 DIAGNOSIS — N130 Hydronephrosis with ureteropelvic junction obstruction: Secondary | ICD-10-CM | POA: Diagnosis not present

## 2021-06-17 DIAGNOSIS — N281 Cyst of kidney, acquired: Secondary | ICD-10-CM | POA: Diagnosis not present

## 2021-06-28 DIAGNOSIS — Z713 Dietary counseling and surveillance: Secondary | ICD-10-CM | POA: Diagnosis not present

## 2021-07-16 DIAGNOSIS — H401111 Primary open-angle glaucoma, right eye, mild stage: Secondary | ICD-10-CM | POA: Diagnosis not present

## 2021-07-16 DIAGNOSIS — H524 Presbyopia: Secondary | ICD-10-CM | POA: Diagnosis not present

## 2021-07-16 DIAGNOSIS — H18593 Other hereditary corneal dystrophies, bilateral: Secondary | ICD-10-CM | POA: Diagnosis not present

## 2021-07-16 DIAGNOSIS — H401122 Primary open-angle glaucoma, left eye, moderate stage: Secondary | ICD-10-CM | POA: Diagnosis not present

## 2021-08-14 DIAGNOSIS — H401122 Primary open-angle glaucoma, left eye, moderate stage: Secondary | ICD-10-CM | POA: Diagnosis not present

## 2021-08-14 DIAGNOSIS — H401111 Primary open-angle glaucoma, right eye, mild stage: Secondary | ICD-10-CM | POA: Diagnosis not present

## 2021-08-30 DIAGNOSIS — R1012 Left upper quadrant pain: Secondary | ICD-10-CM | POA: Diagnosis not present

## 2021-09-11 DIAGNOSIS — R1012 Left upper quadrant pain: Secondary | ICD-10-CM | POA: Diagnosis not present

## 2021-09-24 DIAGNOSIS — Z79899 Other long term (current) drug therapy: Secondary | ICD-10-CM | POA: Diagnosis not present

## 2021-09-24 DIAGNOSIS — R311 Benign essential microscopic hematuria: Secondary | ICD-10-CM | POA: Diagnosis not present

## 2021-09-24 DIAGNOSIS — M62529 Muscle wasting and atrophy, not elsewhere classified, unspecified upper arm: Secondary | ICD-10-CM | POA: Diagnosis not present

## 2021-09-24 DIAGNOSIS — Z0000 Encounter for general adult medical examination without abnormal findings: Secondary | ICD-10-CM | POA: Diagnosis not present

## 2021-09-24 DIAGNOSIS — H40059 Ocular hypertension, unspecified eye: Secondary | ICD-10-CM | POA: Diagnosis not present

## 2021-09-24 DIAGNOSIS — E559 Vitamin D deficiency, unspecified: Secondary | ICD-10-CM | POA: Diagnosis not present

## 2021-09-24 DIAGNOSIS — G43009 Migraine without aura, not intractable, without status migrainosus: Secondary | ICD-10-CM | POA: Diagnosis not present

## 2021-09-24 DIAGNOSIS — Z13220 Encounter for screening for lipoid disorders: Secondary | ICD-10-CM | POA: Diagnosis not present

## 2021-09-24 DIAGNOSIS — G4700 Insomnia, unspecified: Secondary | ICD-10-CM | POA: Diagnosis not present

## 2021-10-14 DIAGNOSIS — R600 Localized edema: Secondary | ICD-10-CM | POA: Diagnosis not present

## 2021-12-10 DIAGNOSIS — N400 Enlarged prostate without lower urinary tract symptoms: Secondary | ICD-10-CM | POA: Diagnosis not present

## 2021-12-17 DIAGNOSIS — N130 Hydronephrosis with ureteropelvic junction obstruction: Secondary | ICD-10-CM | POA: Diagnosis not present

## 2021-12-17 DIAGNOSIS — N200 Calculus of kidney: Secondary | ICD-10-CM | POA: Diagnosis not present

## 2021-12-17 DIAGNOSIS — N400 Enlarged prostate without lower urinary tract symptoms: Secondary | ICD-10-CM | POA: Diagnosis not present

## 2022-02-10 DIAGNOSIS — L570 Actinic keratosis: Secondary | ICD-10-CM | POA: Diagnosis not present

## 2022-02-10 DIAGNOSIS — Z85828 Personal history of other malignant neoplasm of skin: Secondary | ICD-10-CM | POA: Diagnosis not present

## 2022-02-10 DIAGNOSIS — D1801 Hemangioma of skin and subcutaneous tissue: Secondary | ICD-10-CM | POA: Diagnosis not present

## 2022-02-10 DIAGNOSIS — B078 Other viral warts: Secondary | ICD-10-CM | POA: Diagnosis not present

## 2022-02-10 DIAGNOSIS — L821 Other seborrheic keratosis: Secondary | ICD-10-CM | POA: Diagnosis not present

## 2022-02-10 DIAGNOSIS — B351 Tinea unguium: Secondary | ICD-10-CM | POA: Diagnosis not present

## 2022-03-04 ENCOUNTER — Other Ambulatory Visit: Payer: Self-pay | Admitting: Urology

## 2022-03-04 DIAGNOSIS — R310 Gross hematuria: Secondary | ICD-10-CM | POA: Diagnosis not present

## 2022-03-04 DIAGNOSIS — N1330 Unspecified hydronephrosis: Secondary | ICD-10-CM | POA: Diagnosis not present

## 2022-03-04 DIAGNOSIS — R319 Hematuria, unspecified: Secondary | ICD-10-CM | POA: Diagnosis not present

## 2022-03-04 DIAGNOSIS — R1084 Generalized abdominal pain: Secondary | ICD-10-CM | POA: Diagnosis not present

## 2022-03-04 DIAGNOSIS — R109 Unspecified abdominal pain: Secondary | ICD-10-CM | POA: Diagnosis not present

## 2022-03-04 DIAGNOSIS — N200 Calculus of kidney: Secondary | ICD-10-CM | POA: Diagnosis not present

## 2022-03-06 ENCOUNTER — Other Ambulatory Visit: Payer: Self-pay

## 2022-03-06 ENCOUNTER — Encounter (HOSPITAL_BASED_OUTPATIENT_CLINIC_OR_DEPARTMENT_OTHER): Payer: Self-pay | Admitting: Urology

## 2022-03-06 NOTE — Progress Notes (Signed)
Spoke w/ via phone for pre-op interview---pt Lab needs dos----  none             Lab results------ COVID test -----patient states asymptomatic no test needed Arrive at -------900 am 03-20-2022 NPO after MN NO Solid Food.  Clear liquids from MN until---800 am Med rec completed Medications to take morning of surgery -----eye drop Diabetic medication -----n/a Patient instructed no nail polish to be worn day of surgery Patient instructed to bring photo id and insurance card day of surgery Patient aware to have Driver (ride ) / caregiver   wife Andrew Coffey  for 24 hours after surgery  Patient Special Instructions -----none Pre-Op special Istructions -----none Patient verbalized understanding of instructions that were given at this phone interview. Patient denies shortness of breath, chest pain, fever, cough at this phone interview.

## 2022-03-07 DIAGNOSIS — H401122 Primary open-angle glaucoma, left eye, moderate stage: Secondary | ICD-10-CM | POA: Diagnosis not present

## 2022-03-13 DIAGNOSIS — R635 Abnormal weight gain: Secondary | ICD-10-CM | POA: Diagnosis not present

## 2022-03-20 ENCOUNTER — Ambulatory Visit (HOSPITAL_BASED_OUTPATIENT_CLINIC_OR_DEPARTMENT_OTHER): Payer: BC Managed Care – PPO | Admitting: Anesthesiology

## 2022-03-20 ENCOUNTER — Encounter (HOSPITAL_BASED_OUTPATIENT_CLINIC_OR_DEPARTMENT_OTHER): Payer: Self-pay | Admitting: Urology

## 2022-03-20 ENCOUNTER — Encounter (HOSPITAL_BASED_OUTPATIENT_CLINIC_OR_DEPARTMENT_OTHER)
Admission: RE | Disposition: A | Discharge status: Home / Self Care | Payer: Self-pay | Source: Home / Self Care | Attending: Urology

## 2022-03-20 ENCOUNTER — Ambulatory Visit (HOSPITAL_BASED_OUTPATIENT_CLINIC_OR_DEPARTMENT_OTHER)
Admission: RE | Admit: 2022-03-20 | Discharge: 2022-03-20 | Disposition: A | Discharge status: Home / Self Care | Payer: BC Managed Care – PPO | Attending: Urology | Admitting: Urology

## 2022-03-20 ENCOUNTER — Other Ambulatory Visit: Payer: Self-pay

## 2022-03-20 DIAGNOSIS — Z01818 Encounter for other preprocedural examination: Secondary | ICD-10-CM

## 2022-03-20 DIAGNOSIS — N201 Calculus of ureter: Secondary | ICD-10-CM | POA: Diagnosis not present

## 2022-03-20 DIAGNOSIS — K219 Gastro-esophageal reflux disease without esophagitis: Secondary | ICD-10-CM | POA: Diagnosis not present

## 2022-03-20 DIAGNOSIS — N200 Calculus of kidney: Secondary | ICD-10-CM

## 2022-03-20 HISTORY — PX: CYSTOSCOPY/URETEROSCOPY/HOLMIUM LASER/STENT PLACEMENT: SHX6546

## 2022-03-20 SURGERY — CYSTOSCOPY/URETEROSCOPY/HOLMIUM LASER/STENT PLACEMENT
Anesthesia: General | Site: Renal | Laterality: Left

## 2022-03-20 MED ORDER — SUGAMMADEX SODIUM 200 MG/2ML IV SOLN
INTRAVENOUS | Status: DC | PRN
  Administered 2022-03-20: 150 mg via INTRAVENOUS

## 2022-03-20 MED ORDER — CEFAZOLIN SODIUM-DEXTROSE 2-4 GM/100ML-% IV SOLN
INTRAVENOUS | Status: AC
  Filled 2022-03-20: qty 100

## 2022-03-20 MED ORDER — PROPOFOL 10 MG/ML IV BOLUS
INTRAVENOUS | Status: AC
  Filled 2022-03-20: qty 20

## 2022-03-20 MED ORDER — PHENYLEPHRINE 80 MCG/ML (10ML) SYRINGE FOR IV PUSH (FOR BLOOD PRESSURE SUPPORT)
PREFILLED_SYRINGE | INTRAVENOUS | Status: DC | PRN
  Administered 2022-03-20 (×2): 80 ug via INTRAVENOUS

## 2022-03-20 MED ORDER — CIPROFLOXACIN HCL 500 MG PO TABS: 500.0000 mg | ORAL_TABLET | Freq: Once | ORAL | 0 refills | Status: AC

## 2022-03-20 MED ORDER — LIDOCAINE 2% (20 MG/ML) 5 ML SYRINGE: INTRAMUSCULAR | Status: DC | PRN

## 2022-03-20 MED ORDER — SODIUM CHLORIDE 0.9 % IR SOLN
Status: DC | PRN
  Administered 2022-03-20 (×2): 3000 mL

## 2022-03-20 MED ORDER — LIDOCAINE HCL (PF) 2 % IJ SOLN
INTRAMUSCULAR | Status: AC
  Filled 2022-03-20: qty 5

## 2022-03-20 MED ORDER — DEXAMETHASONE SODIUM PHOSPHATE 10 MG/ML IJ SOLN
INTRAMUSCULAR | Status: DC | PRN
  Administered 2022-03-20: 5 mg via INTRAVENOUS

## 2022-03-20 MED ORDER — PHENYLEPHRINE HCL-NACL 20-0.9 MG/250ML-% IV SOLN
INTRAVENOUS | Status: DC | PRN
  Administered 2022-03-20: 30 ug/min via INTRAVENOUS

## 2022-03-20 MED ORDER — IOHEXOL 300 MG/ML  SOLN
INTRAMUSCULAR | Status: DC | PRN
  Administered 2022-03-20: 10 mL

## 2022-03-20 MED ORDER — ONDANSETRON HCL 4 MG/2ML IJ SOLN
INTRAMUSCULAR | Status: DC | PRN
  Administered 2022-03-20: 4 mg via INTRAVENOUS

## 2022-03-20 MED ORDER — WHITE PETROLATUM EX OINT
TOPICAL_OINTMENT | CUTANEOUS | Status: AC
  Filled 2022-03-20: qty 5

## 2022-03-20 MED ORDER — ONDANSETRON HCL 4 MG/2ML IJ SOLN
INTRAMUSCULAR | Status: AC
  Filled 2022-03-20: qty 2

## 2022-03-20 MED ORDER — EPHEDRINE 5 MG/ML INJ
INTRAVENOUS | Status: AC
  Filled 2022-03-20: qty 5

## 2022-03-20 MED ORDER — PROPOFOL 10 MG/ML IV BOLUS
INTRAVENOUS | Status: DC | PRN
  Administered 2022-03-20: 80 mg via INTRAVENOUS
  Administered 2022-03-20: 120 mg via INTRAVENOUS

## 2022-03-20 MED ORDER — LACTATED RINGERS IV SOLN: INTRAVENOUS | Status: DC

## 2022-03-20 MED ORDER — ROCURONIUM BROMIDE 10 MG/ML (PF) SYRINGE
PREFILLED_SYRINGE | INTRAVENOUS | Status: DC | PRN
  Administered 2022-03-20: 20 mg via INTRAVENOUS

## 2022-03-20 MED ORDER — FENTANYL CITRATE (PF) 100 MCG/2ML IJ SOLN
INTRAMUSCULAR | Status: DC | PRN
  Administered 2022-03-20: 75 ug via INTRAVENOUS
  Administered 2022-03-20: 25 ug via INTRAVENOUS

## 2022-03-20 MED ORDER — CEFAZOLIN SODIUM-DEXTROSE 2-4 GM/100ML-% IV SOLN
2.0000 g | INTRAVENOUS | Status: AC
  Administered 2022-03-20: 2 g via INTRAVENOUS

## 2022-03-20 MED ORDER — FENTANYL CITRATE (PF) 100 MCG/2ML IJ SOLN: 25.0000 ug | INTRAMUSCULAR | Status: DC | PRN

## 2022-03-20 MED ORDER — PHENYLEPHRINE 80 MCG/ML (10ML) SYRINGE FOR IV PUSH (FOR BLOOD PRESSURE SUPPORT)
PREFILLED_SYRINGE | INTRAVENOUS | Status: AC
  Filled 2022-03-20: qty 10

## 2022-03-20 MED ORDER — EPHEDRINE SULFATE-NACL 50-0.9 MG/10ML-% IV SOSY
PREFILLED_SYRINGE | INTRAVENOUS | Status: DC | PRN
  Administered 2022-03-20 (×2): 5 mg via INTRAVENOUS

## 2022-03-20 MED ORDER — ACETAMINOPHEN 500 MG PO TABS
ORAL_TABLET | ORAL | Status: AC
  Filled 2022-03-20: qty 2

## 2022-03-20 MED ORDER — DEXAMETHASONE SODIUM PHOSPHATE 10 MG/ML IJ SOLN
INTRAMUSCULAR | Status: AC
  Filled 2022-03-20: qty 1

## 2022-03-20 MED ORDER — FENTANYL CITRATE (PF) 100 MCG/2ML IJ SOLN
INTRAMUSCULAR | Status: AC
  Filled 2022-03-20: qty 2

## 2022-03-20 MED ORDER — ACETAMINOPHEN 500 MG PO TABS
1000.0000 mg | ORAL_TABLET | Freq: Once | ORAL | Status: AC
  Administered 2022-03-20: 1000 mg via ORAL

## 2022-03-20 SURGICAL SUPPLY — 25 items
APL SKNCLS STERI-STRIP NONHPOA (GAUZE/BANDAGES/DRESSINGS) ×1
BAG DRAIN URO-CYSTO SKYTR STRL (DRAIN) ×1 IMPLANT
BAG DRN UROCATH (DRAIN) ×1
BASKET LASER NITINOL 1.9FR (BASKET) IMPLANT
BASKET STONE 1.7 NGAGE (UROLOGICAL SUPPLIES) IMPLANT
BENZOIN TINCTURE PRP APPL 2/3 (GAUZE/BANDAGES/DRESSINGS) IMPLANT
BSKT STON RTRVL 120 1.9FR (BASKET)
CATH URETERAL DUAL LUMEN 10F (MISCELLANEOUS) IMPLANT
CATH URETL OPEN 5X70 (CATHETERS) ×1 IMPLANT
CLOTH BEACON ORANGE TIMEOUT ST (SAFETY) ×1 IMPLANT
DRSG TEGADERM 2-3/8X2-3/4 SM (GAUZE/BANDAGES/DRESSINGS) IMPLANT
EXTRACTOR STONE 1.7FRX115CM (UROLOGICAL SUPPLIES) IMPLANT
GLOVE BIO SURGEON STRL SZ7.5 (GLOVE) ×1 IMPLANT
GOWN STRL REUS W/TWL XL LVL3 (GOWN DISPOSABLE) ×1 IMPLANT
GUIDEWIRE STR DUAL SENSOR (WIRE) ×1 IMPLANT
IV NS IRRIG 3000ML ARTHROMATIC (IV SOLUTION) ×2 IMPLANT
KIT TURNOVER CYSTO (KITS) ×1 IMPLANT
MANIFOLD NEPTUNE II (INSTRUMENTS) ×1 IMPLANT
NS IRRIG 500ML POUR BTL (IV SOLUTION) ×1 IMPLANT
PACK CYSTO (CUSTOM PROCEDURE TRAY) ×1 IMPLANT
SHEATH URETERAL 12FRX35CM (MISCELLANEOUS) IMPLANT
TRACTIP FLEXIVA PULS ID 200XHI (Laser) IMPLANT
TRACTIP FLEXIVA PULSE ID 200 (Laser) ×1
TUBE CONNECTING 12X1/4 (SUCTIONS) IMPLANT
TUBING UROLOGY SET (TUBING) ×1 IMPLANT

## 2022-03-20 NOTE — Discharge Instructions (Addendum)
DISCHARGE INSTRUCTIONS FOR KIDNEY STONE/URETERAL STENT   MEDICATIONS:  1. Resume all your other meds from home.  2. Take Cipro one hour prior to removal of your stent.   ACTIVITY:  1. No strenuous activity x 1week  2. No driving while on narcotic pain medications  3. Drink plenty of water  4. Continue to walk at home - you can still get blood clots when you are at home, so keep active, but don't over do it.  5. May return to work/school tomorrow or when you feel ready   BATHING:  1. You can shower and we recommend daily showers  2. You have a string coming from your urethra: The stent string is attached to your ureteral stent. Do not pull on this.   SIGNS/SYMPTOMS TO CALL:  Please call us if you have a fever greater than 101.5, uncontrolled nausea/vomiting, uncontrolled pain, dizziness, unable to urinate, bloody urine, chest pain, shortness of breath, leg swelling, leg pain, redness around wound, drainage from wound, or any other concerns or questions.   You can reach [place] at [phone].   FOLLOW-UP:  1. You have an appointment in 6 weeks with a ultrasound of your kidneys prior.   2. You have a string attached to your stent, you may remove it on Monday, September 11th. To do this, pull the strings until the stents are completely removed. You may feel an odd sensation in your back.     No acetaminophen/Tylenol until after 3:40 pm today if needed.      Post Anesthesia Home Care Instructions  Activity: Get plenty of rest for the remainder of the day. A responsible individual must stay with you for 24 hours following the procedure.  For the next 24 hours, DO NOT: -Drive a car -Paediatric nurse -Drink alcoholic beverages -Take any medication unless instructed by your physician -Make any legal decisions or sign important papers.  Meals: Start with liquid foods such as gelatin or soup. Progress to regular foods as tolerated. Avoid greasy, spicy, heavy foods. If nausea and/or  vomiting occur, drink only clear liquids until the nausea and/or vomiting subsides. Call your physician if vomiting continues.  Special Instructions/Symptoms: Your throat may feel dry or sore from the anesthesia or the breathing tube placed in your throat during surgery. If this causes discomfort, gargle with warm salt water. The discomfort should disappear within 24 hours.

## 2022-03-20 NOTE — Anesthesia Procedure Notes (Signed)
Procedure Name: LMA Insertion Date/Time: 03/20/2022 12:00 PM  Performed by: Suan Halter, CRNAPre-anesthesia Checklist: Patient identified, Emergency Drugs available, Suction available and Patient being monitored Patient Re-evaluated:Patient Re-evaluated prior to induction Oxygen Delivery Method: Circle system utilized Preoxygenation: Pre-oxygenation with 100% oxygen Induction Type: IV induction Ventilation: Mask ventilation without difficulty LMA: LMA inserted LMA Size: 4.0 Number of attempts: 1 Airway Equipment and Method: Bite block Placement Confirmation: positive ETCO2 Tube secured with: Tape Dental Injury: Teeth and Oropharynx as per pre-operative assessment

## 2022-03-20 NOTE — Anesthesia Preprocedure Evaluation (Addendum)
Anesthesia Evaluation  Patient identified by MRN, date of birth, ID band Patient awake    Reviewed: Allergy & Precautions, NPO status , Patient's Chart, lab work & pertinent test results  Airway Mallampati: II  TM Distance: >3 FB Neck ROM: Full    Dental no notable dental hx. (+) Teeth Intact, Dental Advisory Given   Pulmonary neg pulmonary ROS,    Pulmonary exam normal breath sounds clear to auscultation       Cardiovascular negative cardio ROS Normal cardiovascular exam Rhythm:Regular Rate:Normal     Neuro/Psych  Headaches, negative psych ROS   GI/Hepatic Neg liver ROS, GERD  ,  Endo/Other  negative endocrine ROS  Renal/GU Renal disease  negative genitourinary   Musculoskeletal negative musculoskeletal ROS (+)   Abdominal   Peds  Hematology  (+) Blood dyscrasia, anemia ,   Anesthesia Other Findings Left ureteral stone  Reproductive/Obstetrics                            Anesthesia Physical Anesthesia Plan  ASA: 2  Anesthesia Plan: General   Post-op Pain Management: Tylenol PO (pre-op)*   Induction: Intravenous  PONV Risk Score and Plan: 2 and Ondansetron, Dexamethasone and Treatment may vary due to age or medical condition  Airway Management Planned: LMA  Additional Equipment:   Intra-op Plan:   Post-operative Plan: Extubation in OR  Informed Consent: I have reviewed the patients History and Physical, chart, labs and discussed the procedure including the risks, benefits and alternatives for the proposed anesthesia with the patient or authorized representative who has indicated his/her understanding and acceptance.     Dental advisory given  Plan Discussed with: CRNA  Anesthesia Plan Comments:         Anesthesia Quick Evaluation

## 2022-03-20 NOTE — Interval H&P Note (Signed)
History and Physical Interval Note:  03/20/2022 11:47 AM  Andrew Coffey  has presented today for surgery, with the diagnosis of LEFT URETERAL STONE.  The various methods of treatment have been discussed with the patient and family. After consideration of risks, benefits and other options for treatment, the patient has consented to  Procedure(s) with comments: CYSTOSCOPY/LEFT URETEROSCOPY/HOLMIUM LASER/STONE EXTRACTION/ LEFT URETERAL STENT PLACEMENT (Left) - 100 MINUTES NEEDED as a surgical intervention.  The patient's history has been reviewed, patient examined, no change in status, stable for surgery.  I have reviewed the patient's chart and labs.  Questions were answered to the patient's satisfaction.     Ardis Hughs

## 2022-03-20 NOTE — Op Note (Signed)
Preoperative diagnosis: left ureteral calculus  Postoperative diagnosis: left ureteral calculus  Procedure:  Cystoscopy left ureteroscopy and stone removal Ureteroscopic laser lithotripsy left 44F x 26cm ureteral stent placement  left retrograde pyelography with interpretation  Surgeon: Ardis Hughs, MD  Anesthesia: General  Complications: None  Intraoperative findings:  #1 left retrograde pyelography demonstrated a filling defect within the left renal pelvis at the UPJ consistent with the patient's known calculus without other abnormalities. #2: tight UPJ and capacious renal pelvis #3: Stones dusted.  Small stones removed for analysis. #4: Medial right UO with J-hook UVJ.  EBL: Minimal  Specimens: left ureteral calculus   Indication: Andrew Coffey is a 71 y.o.   patient with a left ureteral stone and associated left symptoms. After reviewing the management options for treatment, the patient elected to proceed with the above surgical procedure(s). We have discussed the potential benefits and risks of the procedure, side effects of the proposed treatment, the likelihood of the patient achieving the goals of the procedure, and any potential problems that might occur during the procedure or recuperation. Informed consent has been obtained.   Description of procedure:  The patient was taken to the operating room and general anesthesia was induced.  The patient was placed in the dorsal lithotomy position, prepped and draped in the usual sterile fashion, and preoperative antibiotics were administered. A preoperative time-out was performed.   Cystourethroscopy was performed.  The patient's urethra was examined and was normal.  The prostate was previously resected with some mild lateral lobe regrowth.  The ureteral orifice ease were medial as compared to usual along the bladder neck.  The bladder was then systematically examined in its entirety. There was no evidence for any  bladder tumors, stones, or other mucosal pathology.    Attention then turned to the left ureteral orifice and a ureteral catheter was used to intubate the ureteral orifice.  Omnipaque contrast was injected through the ureteral catheter and a retrograde pyelogram was performed with findings as dictated above.  A 0.38 sensor guidewire was then advanced up the left ureter into the renal pelvis under fluoroscopic guidance. The 6 Fr semirigid ureteroscope was then advanced into the ureter up to the renal pelvis.  There is no stone identified.  As such I advanced a second wire through the ureteroscope and removed as a wire.  I then subsequently advanced a 35 cm x 12/14 French ureteral access sheath under fluoroscopic guidance up to the proximal ureter removing the inner portion of the access sheath and the wire simultaneously.  Using the flexible scope I was able to encountered the stone in the lower portion of the kidney and with the basket removed it to the upper portion.  Then using a 200 m laser fiber the stone was dusted.  There were several smaller fragments that I was able to grasp with the basket and removed.  I did have a difficulty time navigating the UPJ because it was so tight.  Once I deflated the renal pelvis that opened up the UPJ.    The wire was then backloaded through the cystoscope and a ureteral stent was advance over the wire using Seldinger technique.  The stent was positioned appropriately under fluoroscopic and cystoscopic guidance.  The wire was then removed with an adequate stent curl noted in the renal pelvis as well as in the bladder.  The bladder was then emptied and the procedure ended.  The patient appeared to tolerate the procedure well and without complications.  The patient was able to be awakened and transferred to the recovery unit in satisfactory condition.   Disposition: The tether of the stent was left on and secured to the ventral aspect of the patient's penis.   Instructions for removing the stent have been provided to the patient. The patient has been scheduled for followup in 6 weeks with a renal ultrasound.

## 2022-03-20 NOTE — H&P (Signed)
71 year old male with a history of urinary tract symptoms status post TURP and a large renal cyst status post laparoscopic marsupialization who presents today with 4 days of left-sided flank and abdominal pain, gross hematuria, and nausea. He denies any fevers or chills. He denies any dysuria. He denies any increased urinary urgency or frequency. He has not had any additional testing or lab work. Today the pain has improved somewhat. He was able to exercise and went back to work. However yesterday he left early from work because of the soreness that he is experiencing. The patient did have a renal ultrasound here 2 and half months ago, at which point I noted that the patient did have some nonobstructing stones in the left kidney.     ALLERGIES: No Allergies    MEDICATIONS: Sildenafil Citrate 100 mg tablet 1/2 tablet PO Daily PRN  Sildenafil Citrate 20 mg tablet 2-5 tablets as needed for intimacy  Latanoprost 0.005 % drops  Multivitamins tablet 1 Oral Daily  Redness Reliever Eye Drops 0.012 %-0.2 % drops Ophthalmic  Timolol Maleate 0.5 % drops     GU PSH: Cystoscopy - 09/03/2020 Cystoscopy TURP - 2016 Lap Renal Procedure Unlisted - 01/24/2021       PSH Notes: Transurethral Resection Of Prostate (TURP), Cataract Surgery, Eye Surgery, Tonsillectomy   NON-GU PSH: Remove Tonsils - 2009     GU PMH: BPH w/o LUTS - 12/17/2021, - 09/03/2020, - 2018 (Stable), - 2017, Enlarged prostate without lower urinary tract symptoms (luts), - 2016 Hydronephrosis - 12/17/2021, - 06/17/2021 Renal calculus (Stable) - 12/17/2021, Nephrolithiasis, - 2016 Renal cyst - 06/17/2021, - 02/07/2021, - 01/17/2021, - 09/21/2020, - 09/03/2020 Microscopic hematuria - 01/17/2021, - 09/21/2020, - 09/03/2020 Encounter for Prostate Cancer screening - 09/03/2020, - 2019 ED due to arterial insufficiency - 2018 Other microscopic hematuria, Microscopic hematuria - 2016 Urinary Retention, Unspec, Urinary retention - 2016 Hydronephrosis Unspec,  Hydronephrosis, 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, Bladder diverticulum - 2016 Kidney Failure, acute, Unspec, Acute kidney failure - 2016    NON-GU PMH: Umbilical hernia without obstruction or gangrene - 09/21/2020 Encounter for general adult medical examination without abnormal findings, Encounter for preventive health examination - 2015 Personal history of other specified conditions, History of heartburn - 2014    FAMILY HISTORY: Acute Myocardial Infarction - Father Family Health Status Number - Runs In Family Family Health Status Of Father - Deceased - Grandfather Family Health Status Of Mother - Deceased - Grandfather Tuberculosis - Grandfather   SOCIAL HISTORY: Marital Status: Married Preferred Language: English; Race: White Current Smoking Status: Patient has never smoked.  Drinks 3 drinks per week. Light Drinker.  Drinks 3 caffeinated drinks per day. 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    REVIEW OF SYSTEMS:    GU Review Male:   Patient denies frequent urination, hard to postpone urination, burning/ pain with urination, get up at night to urinate, leakage of urine, stream starts and stops, trouble starting your stream, have to strain to urinate , erection problems, and penile pain.  Gastrointestinal (Upper):   Patient reports nausea. Patient denies vomiting and indigestion/ heartburn.  Gastrointestinal (Lower):   Patient denies diarrhea and constipation.  Constitutional:   Patient denies fever, night sweats, weight loss, and fatigue.  Skin:   Patient denies skin rash/ lesion and itching.  Eyes:   Patient denies blurred vision and double vision.  Ears/ Nose/ Throat:   Patient denies sore throat and sinus problems.  Hematologic/Lymphatic:   Patient denies swollen glands and easy bruising.  Cardiovascular:   Patient denies leg swelling and chest pains.  Respiratory:   Patient denies cough and shortness of breath.  Endocrine:   Patient denies excessive thirst.   Musculoskeletal:   Patient denies back pain and joint pain.  Neurological:   Patient denies headaches and dizziness.  Psychologic:   Patient denies depression and anxiety.   Notes: Left side pain, gross hematuria    VITAL SIGNS:      03/04/2022 10:57 AM  BP 150/87 mmHg  Pulse 61 /min  Temperature 97.6 F / 36.4 C   Complexity of Data:  Source Of History:  Patient  Records Review:   Previous Doctor Records, Previous Patient Records, POC Tool  Urine Test Review:   Urinalysis  X-Ray Review: C.T. Abdomen/Pelvis: Reviewed Films. Discussed With Patient.     12/10/21 09/03/20 06/09/18 05/26/17 06/02/16 08/01/14 07/26/13 08/03/12  PSA  Total PSA 2.89 ng/mL 3.26 ng/mL 2.73 ng/mL 2.48 ng/mL 2.25 ng/dl 2.54  3.21  2.32   Free PSA      0.50     % Free PSA      20       PROCEDURES:         C.T. ABD-Pelv w/o - 74176               Urinalysis w/Scope - 81001 Dipstick Dipstick Cont'd Micro  Color: Red Bilirubin: Neg WBC/hpf: 0 - 5/hpf  Appearance: Slightly Cloudy Ketones: Neg RBC/hpf: 40 - 60/hpf  Specific Gravity: 1.015 Blood: 3+ Bacteria: Few (10-25/hpf)  pH: 6.0 Protein: 1+ Cystals: NS (Not Seen)  Glucose: Neg Urobilinogen: 0.2 Casts: NS (Not Seen)    Nitrites: Neg Trichomonas: Not Present    Leukocyte Esterase: 1+ Mucous: Not Present      Epithelial Cells: NS (Not Seen)      Yeast: NS (Not Seen)      Sperm: Not Present    Notes:  Unspun micro due to increased RBC's.    ASSESSMENT:      ICD-10 Details  1 GU:   Renal calculus - N20.0   2   Gross hematuria - R31.0   3   Flank Pain - R10.84      PLAN:            Medications New Meds: Ondansetron Hcl 4 mg tablet 1 tablet PO Q 8 H PRN   #15  0 Refill(s)  Tramadol Hcl 50 mg tablet 1-2 tablet PO Q 6 H   #15  0 Refill(s)  Pharmacy Name:  [id number]  Address:  [address]   [place], [place] [id number]  Phone:  [phone]  Fax:  [phone]            Orders X-Rays: C.T. Abdomen/Pelvis  Without I.V. Contrast  X-Ray Notes: History:  Hematuria: Yes/No  Patient to see MD after exam: Yes/No  Previous exam: CT / IVP/ US/ KUB/ None  When:  Where:  Diabetic: Yes/ No  BUN/ Creatinine:  Date of last BUN Creatinine:  Weight in pounds:  Allergy- IV Contrast: Yes/ No  Conflicting diabetic meds: Yes/ No  Diabetic Meds:  Prior Authorization #Dillon Bjork # [id number] Valid 03/04/2022 - 04/02/2022          Schedule         Document Letter(s):  Created for Patient: Clinical Summary         Notes:   The patient has a 13 x 12 mm stone in the left proximal ureter with  associated hydroureteronephrosis. We went through the treatment options and I recommended that we proceed with ureteroscopy and stent placement soon as possible. We will try to get this scheduled quickly, but in the meantime I will call in some pain medication and some nausea medication. I did go through ureteroscopy, laser lithotripsy and stent placement with him in detail. I given him a chance to ask all his questions and we have gone through all the answers in detail. He is opted to proceed. We will get this scheduled quickly.

## 2022-03-20 NOTE — Anesthesia Postprocedure Evaluation (Signed)
Anesthesia Post Note  Patient: NAHOM CARFAGNO  Procedure(s) Performed: CYSTOSCOPY/LEFT URETEROSCOPY/HOLMIUM LASER/STONE EXTRACTION/ LEFT URETERAL STENT PLACEMENT (Left: Renal)     Patient location during evaluation: PACU Anesthesia Type: General Level of consciousness: awake and alert Pain management: pain level controlled Vital Signs Assessment: post-procedure vital signs reviewed and stable Respiratory status: spontaneous breathing, nonlabored ventilation and respiratory function stable Cardiovascular status: blood pressure returned to baseline and stable Postop Assessment: no apparent nausea or vomiting Anesthetic complications: no   No notable events documented.  Last Vitals:  Vitals:   03/20/22 1400 03/20/22 1419  BP: (!) 139/91 (!) 140/85  Pulse: 61 (!) 57  Resp: 16 16  Temp:  36.6 C  SpO2: 100% 98%    Last Pain:  Vitals:   03/20/22 1419  TempSrc:   PainSc: 4                  Serayah Yazdani

## 2022-03-20 NOTE — Transfer of Care (Signed)
Immediate Anesthesia Transfer of Care Note  Patient: Andrew Coffey  Procedure(s) Performed: Procedure(s) (LRB): CYSTOSCOPY/LEFT URETEROSCOPY/HOLMIUM LASER/STONE EXTRACTION/ LEFT URETERAL STENT PLACEMENT (Left)  Patient Location: PACU  Anesthesia Type: General  Level of Consciousness: awake, oriented, sedated and patient cooperative  Airway & Oxygen Therapy: Patient Spontanous Breathing and Patient connected to face mask oxygen  Post-op Assessment: Report given to PACU RN and Post -op Vital signs reviewed and stable  Post vital signs: Reviewed and stable  Complications: No apparent anesthesia complications Last Vitals:  Vitals Value Taken Time  BP 119/69 03/20/22 1330  Temp    Pulse 69 03/20/22 1332  Resp 14 03/20/22 1331  SpO2 99 % 03/20/22 1332  Vitals shown include unvalidated device data.  Last Pain:  Vitals:   03/20/22 0934  TempSrc: Oral  PainSc: 0-No pain      Patients Stated Pain Goal: 5 (78/58/85 0277)  Complications: No notable events documented.

## 2022-03-21 ENCOUNTER — Encounter (HOSPITAL_BASED_OUTPATIENT_CLINIC_OR_DEPARTMENT_OTHER): Payer: Self-pay | Admitting: Urology

## 2022-03-27 LAB — CALCULI, WITH PHOTOGRAPH (CLINICAL LAB)
Calcium Oxalate Dihydrate: 10 %
Calcium Oxalate Monohydrate: 80 %
Hydroxyapatite: 10 %
Weight Calculi: 22 mg

## 2022-04-29 DIAGNOSIS — N130 Hydronephrosis with ureteropelvic junction obstruction: Secondary | ICD-10-CM | POA: Diagnosis not present

## 2022-04-29 DIAGNOSIS — N200 Calculus of kidney: Secondary | ICD-10-CM | POA: Diagnosis not present

## 2022-09-03 DIAGNOSIS — H524 Presbyopia: Secondary | ICD-10-CM | POA: Diagnosis not present

## 2022-09-03 DIAGNOSIS — H401121 Primary open-angle glaucoma, left eye, mild stage: Secondary | ICD-10-CM | POA: Diagnosis not present

## 2022-09-03 DIAGNOSIS — H401112 Primary open-angle glaucoma, right eye, moderate stage: Secondary | ICD-10-CM | POA: Diagnosis not present

## 2022-09-03 DIAGNOSIS — H43812 Vitreous degeneration, left eye: Secondary | ICD-10-CM | POA: Diagnosis not present

## 2022-09-03 DIAGNOSIS — H18592 Other hereditary corneal dystrophies, left eye: Secondary | ICD-10-CM | POA: Diagnosis not present

## 2022-09-03 DIAGNOSIS — H52203 Unspecified astigmatism, bilateral: Secondary | ICD-10-CM | POA: Diagnosis not present

## 2022-10-01 DIAGNOSIS — D509 Iron deficiency anemia, unspecified: Secondary | ICD-10-CM | POA: Diagnosis not present

## 2022-10-01 DIAGNOSIS — K625 Hemorrhage of anus and rectum: Secondary | ICD-10-CM | POA: Diagnosis not present

## 2022-10-01 DIAGNOSIS — R030 Elevated blood-pressure reading, without diagnosis of hypertension: Secondary | ICD-10-CM | POA: Diagnosis not present

## 2022-10-20 DIAGNOSIS — R972 Elevated prostate specific antigen [PSA]: Secondary | ICD-10-CM | POA: Diagnosis not present

## 2022-10-27 DIAGNOSIS — R972 Elevated prostate specific antigen [PSA]: Secondary | ICD-10-CM | POA: Diagnosis not present

## 2022-10-27 DIAGNOSIS — N200 Calculus of kidney: Secondary | ICD-10-CM | POA: Diagnosis not present

## 2022-10-31 ENCOUNTER — Other Ambulatory Visit: Payer: Self-pay | Admitting: Urology

## 2022-10-31 DIAGNOSIS — N209 Urinary calculus, unspecified: Secondary | ICD-10-CM | POA: Diagnosis not present

## 2022-10-31 DIAGNOSIS — N200 Calculus of kidney: Secondary | ICD-10-CM | POA: Diagnosis not present

## 2022-11-10 ENCOUNTER — Ambulatory Visit: Payer: Self-pay | Admitting: Surgery

## 2022-11-10 DIAGNOSIS — K648 Other hemorrhoids: Secondary | ICD-10-CM | POA: Insufficient documentation

## 2022-11-10 DIAGNOSIS — Z9889 Other specified postprocedural states: Secondary | ICD-10-CM | POA: Diagnosis not present

## 2022-11-10 DIAGNOSIS — Z860100 Personal history of colon polyps, unspecified: Secondary | ICD-10-CM | POA: Insufficient documentation

## 2022-11-10 DIAGNOSIS — K643 Fourth degree hemorrhoids: Secondary | ICD-10-CM | POA: Diagnosis not present

## 2022-11-10 DIAGNOSIS — K625 Hemorrhage of anus and rectum: Secondary | ICD-10-CM | POA: Diagnosis not present

## 2022-11-24 DIAGNOSIS — J0101 Acute recurrent maxillary sinusitis: Secondary | ICD-10-CM | POA: Diagnosis not present

## 2022-12-04 DIAGNOSIS — Z713 Dietary counseling and surveillance: Secondary | ICD-10-CM | POA: Diagnosis not present

## 2022-12-04 DIAGNOSIS — Z23 Encounter for immunization: Secondary | ICD-10-CM | POA: Diagnosis not present

## 2022-12-04 DIAGNOSIS — E559 Vitamin D deficiency, unspecified: Secondary | ICD-10-CM | POA: Diagnosis not present

## 2022-12-04 DIAGNOSIS — Z79899 Other long term (current) drug therapy: Secondary | ICD-10-CM | POA: Diagnosis not present

## 2022-12-04 DIAGNOSIS — Z0000 Encounter for general adult medical examination without abnormal findings: Secondary | ICD-10-CM | POA: Diagnosis not present

## 2022-12-04 DIAGNOSIS — D509 Iron deficiency anemia, unspecified: Secondary | ICD-10-CM | POA: Diagnosis not present

## 2022-12-25 DIAGNOSIS — Z713 Dietary counseling and surveillance: Secondary | ICD-10-CM | POA: Diagnosis not present

## 2022-12-25 DIAGNOSIS — Z6825 Body mass index (BMI) 25.0-25.9, adult: Secondary | ICD-10-CM | POA: Diagnosis not present

## 2023-01-13 DIAGNOSIS — R972 Elevated prostate specific antigen [PSA]: Secondary | ICD-10-CM | POA: Diagnosis not present

## 2023-01-20 DIAGNOSIS — N200 Calculus of kidney: Secondary | ICD-10-CM | POA: Diagnosis not present

## 2023-01-20 DIAGNOSIS — N130 Hydronephrosis with ureteropelvic junction obstruction: Secondary | ICD-10-CM | POA: Diagnosis not present

## 2023-01-21 DIAGNOSIS — Z6825 Body mass index (BMI) 25.0-25.9, adult: Secondary | ICD-10-CM | POA: Diagnosis not present

## 2023-01-21 DIAGNOSIS — Z713 Dietary counseling and surveillance: Secondary | ICD-10-CM | POA: Diagnosis not present

## 2023-02-09 DIAGNOSIS — Z86010 Personal history of colonic polyps: Secondary | ICD-10-CM | POA: Diagnosis not present

## 2023-02-09 DIAGNOSIS — K625 Hemorrhage of anus and rectum: Secondary | ICD-10-CM | POA: Diagnosis not present

## 2023-02-09 DIAGNOSIS — K649 Unspecified hemorrhoids: Secondary | ICD-10-CM | POA: Diagnosis not present

## 2023-02-13 DIAGNOSIS — Z713 Dietary counseling and surveillance: Secondary | ICD-10-CM | POA: Diagnosis not present

## 2023-02-13 DIAGNOSIS — Z6825 Body mass index (BMI) 25.0-25.9, adult: Secondary | ICD-10-CM | POA: Diagnosis not present

## 2023-02-24 DIAGNOSIS — Z85828 Personal history of other malignant neoplasm of skin: Secondary | ICD-10-CM | POA: Diagnosis not present

## 2023-02-24 DIAGNOSIS — B351 Tinea unguium: Secondary | ICD-10-CM | POA: Diagnosis not present

## 2023-02-24 DIAGNOSIS — L821 Other seborrheic keratosis: Secondary | ICD-10-CM | POA: Diagnosis not present

## 2023-02-24 DIAGNOSIS — L570 Actinic keratosis: Secondary | ICD-10-CM | POA: Diagnosis not present

## 2023-02-24 DIAGNOSIS — D2372 Other benign neoplasm of skin of left lower limb, including hip: Secondary | ICD-10-CM | POA: Diagnosis not present

## 2023-03-02 ENCOUNTER — Telehealth: Payer: Self-pay | Admitting: Podiatry

## 2023-03-02 NOTE — Telephone Encounter (Signed)
Pt left message 8.16 at 243pm stating was a pt previously and is having toe pain from a fungus and would like an appt.  I have left message for pt to call to schedule an appt.

## 2023-03-04 DIAGNOSIS — H401122 Primary open-angle glaucoma, left eye, moderate stage: Secondary | ICD-10-CM | POA: Diagnosis not present

## 2023-03-12 ENCOUNTER — Ambulatory Visit (INDEPENDENT_AMBULATORY_CARE_PROVIDER_SITE_OTHER): Payer: BC Managed Care – PPO | Admitting: Podiatry

## 2023-03-12 ENCOUNTER — Encounter: Payer: Self-pay | Admitting: Podiatry

## 2023-03-12 DIAGNOSIS — B351 Tinea unguium: Secondary | ICD-10-CM

## 2023-03-12 DIAGNOSIS — B079 Viral wart, unspecified: Secondary | ICD-10-CM

## 2023-03-12 DIAGNOSIS — Q828 Other specified congenital malformations of skin: Secondary | ICD-10-CM

## 2023-03-12 MED ORDER — TERBINAFINE HCL 250 MG PO TABS: 250.0000 mg | ORAL_TABLET | Freq: Every day | ORAL | 0 refills | Status: DC

## 2023-03-12 NOTE — Progress Notes (Signed)
Subjective:   Patient ID: Andrew Coffey, male   DOB: 72 y.o.   MRN: [id number]   HPI Patient presents stating his nails have gotten bad again over the last 4 years and they did very well for several years and he like to pursue the same treatment and he has a lesion that come up on the left foot   Review of Systems  All other systems reviewed and are negative.       Objective:  Physical Exam Vitals and nursing note reviewed.  Constitutional:      Appearance: He is well-developed.  Pulmonary:     Effort: Pulmonary effort is normal.  Musculoskeletal:        General: Normal range of motion.  Skin:    General: Skin is warm.  Neurological:     Mental Status: He is alert.     Neurovascular status intact muscle strength was found to be adequate range of motion adequate with patient noted to have significant thickness of nailbeds 1-5 both feet with the fifth nail right being the worst but all of them being involved and also has a lesion submetatarsal left of just short-term duration slight lucent core but upon debridement has pinpoint bleeding     Assessment:  Reoccurrence mycotic nail infection with patient having had liver function studies which are normal with verruca plantaris possible left or porokeratotic lesion     Plan:  H&P reviewed all conditions and he wants to pursue oral medicine and laser that did so well for him 4 years ago and I did explain this and risk and he wants to pursue this and is placed back on Lamisil for 90 days scheduled for laser treatment and I did debride the lesion left may require further treatment in future

## 2023-03-25 DIAGNOSIS — Z713 Dietary counseling and surveillance: Secondary | ICD-10-CM | POA: Diagnosis not present

## 2023-03-25 DIAGNOSIS — Z6825 Body mass index (BMI) 25.0-25.9, adult: Secondary | ICD-10-CM | POA: Diagnosis not present

## 2023-04-01 DIAGNOSIS — Z6825 Body mass index (BMI) 25.0-25.9, adult: Secondary | ICD-10-CM | POA: Diagnosis not present

## 2023-04-01 DIAGNOSIS — Z713 Dietary counseling and surveillance: Secondary | ICD-10-CM | POA: Diagnosis not present

## 2023-04-10 DIAGNOSIS — Z23 Encounter for immunization: Secondary | ICD-10-CM | POA: Diagnosis not present

## 2023-04-24 ENCOUNTER — Ambulatory Visit (INDEPENDENT_AMBULATORY_CARE_PROVIDER_SITE_OTHER): Payer: BC Managed Care – PPO | Admitting: Podiatry

## 2023-04-24 DIAGNOSIS — B351 Tinea unguium: Secondary | ICD-10-CM

## 2023-04-24 NOTE — Patient Instructions (Signed)

## 2023-04-24 NOTE — Progress Notes (Signed)
Patient presents today for the 1st laser treatment. Diagnosed with mycotic nail infection by Dr. Charlsie Merles.   Toenails 1-5 bilaterally are effected notable or increased thickness, elongation, discoloration with subungal debris present.  All other systems are negative.  Nails were filed thin. Laser therapy was administered to 1-5 toenails bilaterally and patient tolerated the treatment well. All safety precautions were in place.     Follow up in 4 weeks for laser # 2.

## 2023-05-21 ENCOUNTER — Encounter: Payer: Self-pay | Admitting: Podiatry

## 2023-05-21 ENCOUNTER — Ambulatory Visit: Payer: BC Managed Care – PPO | Admitting: Podiatry

## 2023-05-21 VITALS — Ht 70.0 in | Wt 165.2 lb

## 2023-05-21 DIAGNOSIS — B070 Plantar wart: Secondary | ICD-10-CM

## 2023-05-21 MED ORDER — FLUOROURACIL 5 % EX CREA: TOPICAL_CREAM | Freq: Two times a day (BID) | CUTANEOUS | 2 refills | Status: DC

## 2023-05-22 NOTE — Progress Notes (Signed)
Subjective:   Patient ID: Andrew Coffey, male   DOB: 72 y.o.   MRN: [id number]   HPI Patient presents stating this placed on the bottom of my left foot has come back again and it is bothersome and at times hard to walk on   ROS      Objective:  Physical Exam  Neurovascular status intact with the patient found to have keratotic lesions subleft fourth metatarsal measures about 8 mm x 8 mm and upon debridement shows pinpoint bleeding     Assessment:  Verruca plantaris plantar aspect left     Plan:  H&P reviewed went ahead today did Sharp sterile debridement of area applied chemical agent to kill virus and instructed on what to do if blistering were to occur and wrote prescription for Efudex to use at home and reappoint as needed

## 2023-05-29 ENCOUNTER — Ambulatory Visit (INDEPENDENT_AMBULATORY_CARE_PROVIDER_SITE_OTHER): Payer: BC Managed Care – PPO | Admitting: *Deleted

## 2023-05-29 DIAGNOSIS — B351 Tinea unguium: Secondary | ICD-10-CM

## 2023-05-29 NOTE — Progress Notes (Signed)
Patient presents today for the 2nd laser treatment. Diagnosed with mycotic nail infection by Dr. Charlsie Merles.   Toenails 1-5 bilaterally are affected notable or increased thickness, elongation, discoloration with subungal debris present.  All other systems are negative.  Nails were filed thin. Laser therapy was administered to 1-5 toenails bilaterally and patient tolerated the treatment well. All safety precautions were in place.     Follow up in 4 weeks for laser # 3

## 2023-06-26 ENCOUNTER — Ambulatory Visit (INDEPENDENT_AMBULATORY_CARE_PROVIDER_SITE_OTHER): Payer: BC Managed Care – PPO | Admitting: Podiatrist

## 2023-06-26 DIAGNOSIS — B351 Tinea unguium: Secondary | ICD-10-CM

## 2023-06-26 NOTE — Progress Notes (Signed)
Patient presents today for the 3rd laser treatment. Diagnosed with mycotic nail infection by Dr. Charlsie Merles.   Toenails 1-5 bilaterally are affected notable or increased thickness, elongation, discoloration with subungal debris present.  All other systems are negative.  Nails were filed thin. Laser therapy was administered to 1-5 toenails bilaterally and patient tolerated the treatment well. All safety precautions were in place.     Follow up in 6 weeks for laser # 4

## 2023-07-28 DIAGNOSIS — L738 Other specified follicular disorders: Secondary | ICD-10-CM | POA: Diagnosis not present

## 2023-07-31 ENCOUNTER — Other Ambulatory Visit: Payer: BC Managed Care – PPO

## 2023-08-05 DIAGNOSIS — D120 Benign neoplasm of cecum: Secondary | ICD-10-CM | POA: Diagnosis not present

## 2023-08-05 DIAGNOSIS — Z09 Encounter for follow-up examination after completed treatment for conditions other than malignant neoplasm: Secondary | ICD-10-CM | POA: Diagnosis not present

## 2023-08-05 DIAGNOSIS — K5730 Diverticulosis of large intestine without perforation or abscess without bleeding: Secondary | ICD-10-CM | POA: Diagnosis not present

## 2023-08-05 DIAGNOSIS — Z860101 Personal history of adenomatous and serrated colon polyps: Secondary | ICD-10-CM | POA: Diagnosis not present

## 2023-08-28 ENCOUNTER — Ambulatory Visit (INDEPENDENT_AMBULATORY_CARE_PROVIDER_SITE_OTHER): Payer: BC Managed Care – PPO

## 2023-08-28 DIAGNOSIS — B351 Tinea unguium: Secondary | ICD-10-CM

## 2023-08-28 NOTE — Progress Notes (Signed)
Patient presents today for the 4th laser treatment. Diagnosed with mycotic nail infection by Dr. Charlsie Merles.   Toenail most affected 1-5 bilaterally.  Patient has noticed some improvement in the appearance of the nails. He has also stopped the terbinafine about a month ago.  Nails were filed thin. Laser therapy was administered to 1-5 toenails bilaterally and patient tolerated the treatment well. All safety precautions were in place.     Follow up in 6 weeks for laser # 5.

## 2023-09-04 DIAGNOSIS — H02831 Dermatochalasis of right upper eyelid: Secondary | ICD-10-CM | POA: Diagnosis not present

## 2023-09-04 DIAGNOSIS — H02834 Dermatochalasis of left upper eyelid: Secondary | ICD-10-CM | POA: Diagnosis not present

## 2023-09-04 DIAGNOSIS — H52201 Unspecified astigmatism, right eye: Secondary | ICD-10-CM | POA: Diagnosis not present

## 2023-09-04 DIAGNOSIS — H18593 Other hereditary corneal dystrophies, bilateral: Secondary | ICD-10-CM | POA: Diagnosis not present

## 2023-09-04 DIAGNOSIS — H401122 Primary open-angle glaucoma, left eye, moderate stage: Secondary | ICD-10-CM | POA: Diagnosis not present

## 2023-10-12 ENCOUNTER — Ambulatory Visit (INDEPENDENT_AMBULATORY_CARE_PROVIDER_SITE_OTHER): Payer: BC Managed Care – PPO | Admitting: Podiatrist

## 2023-10-12 DIAGNOSIS — B351 Tinea unguium: Secondary | ICD-10-CM

## 2023-10-12 NOTE — Progress Notes (Signed)
 Patient presents today for the 5th laser treatment. Diagnosed with mycotic nail infection by Dr. Charlsie Merles.   Toenail most affected 1-5 bilaterally.  Patient has not seen improvement in the appearance of the nails. He also took  90 days of terbinafine and finished this medication January 2025.  Clinical signs of mycotic infection are present in nails x 10 with yellowish discoloration and thickened and crumbly texture of nails.    Nails were filed thin. Laser therapy was administered to 1-5 toenails bilaterally and patient tolerated the treatment well. All safety precautions were in place.   Discussed follow up with Dr. Charlsie Merles to assess further treatment recommendations.

## 2023-10-22 ENCOUNTER — Encounter: Payer: Self-pay | Admitting: Podiatry

## 2023-10-22 ENCOUNTER — Ambulatory Visit (INDEPENDENT_AMBULATORY_CARE_PROVIDER_SITE_OTHER): Admitting: Podiatry

## 2023-10-22 VITALS — Ht 70.0 in | Wt 165.0 lb

## 2023-10-22 DIAGNOSIS — B351 Tinea unguium: Secondary | ICD-10-CM

## 2023-10-22 NOTE — Progress Notes (Signed)
 Subjective:   Patient ID: Andrew Coffey, male   DOB: 73 y.o.   MRN: [id number]   HPI Patient presents with nail disease 1-5 both feet that he has had laser and did oral Lamisil last year.  States there has been moderate improvement with more problems with the fifth digit right that he just wanted checked   ROS      Objective:  Physical Exam  Neurovascular status intact continues to have distal discoloration of nailbeds bilateral with thickness of the right fifth nail which at times is more pathological for him     Assessment:  Mycotic nail infection bilateral that has had oral medicine laser therapy with moderate improvement but continuing to have some issues     Plan:  H&P reviewed condition and discussed.  At this point I reviewed with him that I do not want to put him on any more oral medicine currently and I do not recommend topical and I do recommend pedicures to try to keep the nails thinned with the possibility at 1 point future removal of the fifth nail right which I educated him on today

## 2023-10-29 DIAGNOSIS — M79604 Pain in right leg: Secondary | ICD-10-CM | POA: Diagnosis not present

## 2023-12-10 ENCOUNTER — Other Ambulatory Visit (HOSPITAL_COMMUNITY): Payer: Self-pay | Admitting: Internal Medicine

## 2023-12-10 DIAGNOSIS — G43009 Migraine without aura, not intractable, without status migrainosus: Secondary | ICD-10-CM | POA: Diagnosis not present

## 2023-12-10 DIAGNOSIS — Z13220 Encounter for screening for lipoid disorders: Secondary | ICD-10-CM

## 2023-12-10 DIAGNOSIS — D649 Anemia, unspecified: Secondary | ICD-10-CM | POA: Diagnosis not present

## 2023-12-10 DIAGNOSIS — Z0000 Encounter for general adult medical examination without abnormal findings: Secondary | ICD-10-CM | POA: Diagnosis not present

## 2023-12-10 DIAGNOSIS — E559 Vitamin D deficiency, unspecified: Secondary | ICD-10-CM | POA: Diagnosis not present

## 2023-12-10 DIAGNOSIS — Z79899 Other long term (current) drug therapy: Secondary | ICD-10-CM | POA: Diagnosis not present

## 2024-01-20 ENCOUNTER — Ambulatory Visit (HOSPITAL_COMMUNITY)
Admission: RE | Admit: 2024-01-20 | Discharge: 2024-01-20 | Disposition: A | Discharge status: Home / Self Care | Payer: Self-pay | Source: Ambulatory Visit | Attending: Internal Medicine | Admitting: Internal Medicine

## 2024-01-20 DIAGNOSIS — Z13220 Encounter for screening for lipoid disorders: Secondary | ICD-10-CM | POA: Insufficient documentation

## 2024-01-27 DIAGNOSIS — Z713 Dietary counseling and surveillance: Secondary | ICD-10-CM | POA: Diagnosis not present

## 2024-01-27 DIAGNOSIS — Z8249 Family history of ischemic heart disease and other diseases of the circulatory system: Secondary | ICD-10-CM | POA: Diagnosis not present

## 2024-01-27 DIAGNOSIS — E663 Overweight: Secondary | ICD-10-CM | POA: Diagnosis not present

## 2024-01-27 DIAGNOSIS — Z8241 Family history of sudden cardiac death: Secondary | ICD-10-CM | POA: Diagnosis not present

## 2024-01-27 DIAGNOSIS — Z6825 Body mass index (BMI) 25.0-25.9, adult: Secondary | ICD-10-CM | POA: Diagnosis not present

## 2024-02-02 ENCOUNTER — Other Ambulatory Visit: Payer: Self-pay | Admitting: Internal Medicine

## 2024-02-02 DIAGNOSIS — I2584 Coronary atherosclerosis due to calcified coronary lesion: Secondary | ICD-10-CM | POA: Diagnosis not present

## 2024-02-02 DIAGNOSIS — I7121 Aneurysm of the ascending aorta, without rupture: Secondary | ICD-10-CM

## 2024-02-02 DIAGNOSIS — I2510 Atherosclerotic heart disease of native coronary artery without angina pectoris: Secondary | ICD-10-CM | POA: Diagnosis not present

## 2024-02-11 ENCOUNTER — Ambulatory Visit
Admission: RE | Admit: 2024-02-11 | Discharge: 2024-02-11 | Disposition: A | Discharge status: Home / Self Care | Source: Ambulatory Visit | Attending: Internal Medicine | Admitting: Internal Medicine

## 2024-02-11 DIAGNOSIS — R918 Other nonspecific abnormal finding of lung field: Secondary | ICD-10-CM | POA: Diagnosis not present

## 2024-02-11 DIAGNOSIS — I77810 Thoracic aortic ectasia: Secondary | ICD-10-CM | POA: Diagnosis not present

## 2024-02-11 DIAGNOSIS — I7121 Aneurysm of the ascending aorta, without rupture: Secondary | ICD-10-CM

## 2024-02-11 MED ORDER — IOPAMIDOL (ISOVUE-370) INJECTION 76%
80.0000 mL | Freq: Once | INTRAVENOUS | Status: AC | PRN
  Administered 2024-02-11: 80 mL via INTRAVENOUS

## 2024-02-29 DIAGNOSIS — B351 Tinea unguium: Secondary | ICD-10-CM | POA: Diagnosis not present

## 2024-02-29 DIAGNOSIS — L570 Actinic keratosis: Secondary | ICD-10-CM | POA: Diagnosis not present

## 2024-02-29 DIAGNOSIS — L821 Other seborrheic keratosis: Secondary | ICD-10-CM | POA: Diagnosis not present

## 2024-02-29 DIAGNOSIS — D225 Melanocytic nevi of trunk: Secondary | ICD-10-CM | POA: Diagnosis not present

## 2024-02-29 DIAGNOSIS — Z85828 Personal history of other malignant neoplasm of skin: Secondary | ICD-10-CM | POA: Diagnosis not present

## 2024-03-18 DIAGNOSIS — I2510 Atherosclerotic heart disease of native coronary artery without angina pectoris: Secondary | ICD-10-CM | POA: Diagnosis not present

## 2024-03-18 DIAGNOSIS — I2584 Coronary atherosclerosis due to calcified coronary lesion: Secondary | ICD-10-CM | POA: Diagnosis not present

## 2024-03-22 ENCOUNTER — Observation Stay (HOSPITAL_COMMUNITY)

## 2024-03-22 ENCOUNTER — Encounter (HOSPITAL_COMMUNITY): Payer: Self-pay

## 2024-03-22 ENCOUNTER — Observation Stay (HOSPITAL_COMMUNITY)
Admission: EM | Admit: 2024-03-22 | Discharge: 2024-03-23 | Disposition: A | Discharge status: Home / Self Care | Attending: Cardiology | Admitting: Cardiology

## 2024-03-22 ENCOUNTER — Emergency Department (HOSPITAL_COMMUNITY)

## 2024-03-22 ENCOUNTER — Other Ambulatory Visit: Payer: Self-pay

## 2024-03-22 DIAGNOSIS — R072 Precordial pain: Secondary | ICD-10-CM

## 2024-03-22 DIAGNOSIS — I959 Hypotension, unspecified: Secondary | ICD-10-CM | POA: Insufficient documentation

## 2024-03-22 DIAGNOSIS — R079 Chest pain, unspecified: Secondary | ICD-10-CM | POA: Diagnosis not present

## 2024-03-22 DIAGNOSIS — E7800 Pure hypercholesterolemia, unspecified: Secondary | ICD-10-CM | POA: Diagnosis not present

## 2024-03-22 DIAGNOSIS — Z79899 Other long term (current) drug therapy: Secondary | ICD-10-CM | POA: Diagnosis not present

## 2024-03-22 DIAGNOSIS — R931 Abnormal findings on diagnostic imaging of heart and coronary circulation: Secondary | ICD-10-CM | POA: Diagnosis present

## 2024-03-22 DIAGNOSIS — I77810 Thoracic aortic ectasia: Secondary | ICD-10-CM | POA: Diagnosis present

## 2024-03-22 DIAGNOSIS — Z7982 Long term (current) use of aspirin: Secondary | ICD-10-CM | POA: Insufficient documentation

## 2024-03-22 DIAGNOSIS — N281 Cyst of kidney, acquired: Secondary | ICD-10-CM | POA: Diagnosis not present

## 2024-03-22 DIAGNOSIS — K5730 Diverticulosis of large intestine without perforation or abscess without bleeding: Secondary | ICD-10-CM | POA: Diagnosis not present

## 2024-03-22 DIAGNOSIS — I214 Non-ST elevation (NSTEMI) myocardial infarction: Secondary | ICD-10-CM | POA: Diagnosis not present

## 2024-03-22 DIAGNOSIS — Z85828 Personal history of other malignant neoplasm of skin: Secondary | ICD-10-CM | POA: Insufficient documentation

## 2024-03-22 DIAGNOSIS — N200 Calculus of kidney: Secondary | ICD-10-CM | POA: Diagnosis not present

## 2024-03-22 DIAGNOSIS — I7121 Aneurysm of the ascending aorta, without rupture: Secondary | ICD-10-CM | POA: Diagnosis not present

## 2024-03-22 DIAGNOSIS — H409 Unspecified glaucoma: Secondary | ICD-10-CM

## 2024-03-22 DIAGNOSIS — R0789 Other chest pain: Secondary | ICD-10-CM | POA: Diagnosis not present

## 2024-03-22 LAB — TROPONIN T, HIGH SENSITIVITY
Troponin T High Sensitivity: 30 ng/L — ABNORMAL HIGH (ref 0–19)
Troponin T High Sensitivity: 61 ng/L — ABNORMAL HIGH (ref 0–19)
Troponin T High Sensitivity: 102 ng/L (ref 0–19)
Troponin T High Sensitivity: 94 ng/L — ABNORMAL HIGH (ref 0–19)

## 2024-03-22 LAB — CBC
HCT: 42.6 % (ref 39.0–52.0)
Hemoglobin: 13.5 g/dL (ref 13.0–17.0)
MCH: 29.5 pg (ref 26.0–34.0)
MCHC: 31.7 g/dL (ref 30.0–36.0)
MCV: 93.2 fL (ref 80.0–100.0)
Platelets: 164 K/uL (ref 150–400)
RBC: 4.57 MIL/uL (ref 4.22–5.81)
RDW: 13.7 % (ref 11.5–15.5)
WBC: 6.5 K/uL (ref 4.0–10.5)
nRBC: 0 % (ref 0.0–0.2)

## 2024-03-22 LAB — BASIC METABOLIC PANEL WITH GFR
Anion gap: 18 — ABNORMAL HIGH (ref 5–15)
BUN: 17 mg/dL (ref 8–23)
CO2: 21 mmol/L — ABNORMAL LOW (ref 22–32)
Calcium: 10 mg/dL (ref 8.9–10.3)
Chloride: 103 mmol/L (ref 98–111)
Creatinine, Ser: 1.46 mg/dL — ABNORMAL HIGH (ref 0.61–1.24)
GFR, Estimated: 50 mL/min — ABNORMAL LOW (ref >60)
Glucose, Bld: 112 mg/dL — ABNORMAL HIGH (ref 70–99)
Potassium: 4.2 mmol/L (ref 3.5–5.1)
Sodium: 142 mmol/L (ref 135–145)

## 2024-03-22 LAB — LIPID PANEL
Cholesterol: 114 mg/dL (ref 0–200)
HDL: 49 mg/dL (ref >40)
LDL Cholesterol: 53 mg/dL (ref 0–99)
Total CHOL/HDL Ratio: 2.3 ratio
Triglycerides: 59 mg/dL (ref <150)
VLDL: 12 mg/dL (ref 0–40)

## 2024-03-22 LAB — HEMOGLOBIN A1C
Hgb A1c MFr Bld: 5.4 % (ref 4.8–5.6)
Mean Plasma Glucose: 108.28 mg/dL

## 2024-03-22 MED ORDER — SODIUM CHLORIDE 0.9% FLUSH
3.0000 mL | Freq: Two times a day (BID) | INTRAVENOUS | Status: DC
  Administered 2024-03-22: 3 mL via INTRAVENOUS

## 2024-03-22 MED ORDER — ONDANSETRON HCL 4 MG PO TABS: 4.0000 mg | ORAL_TABLET | Freq: Four times a day (QID) | ORAL | Status: DC | PRN

## 2024-03-22 MED ORDER — ATORVASTATIN CALCIUM 20 MG PO TABS
20.0000 mg | ORAL_TABLET | Freq: Every day | ORAL | Status: DC
  Administered 2024-03-22: 20 mg via ORAL
  Filled 2024-03-22: qty 2

## 2024-03-22 MED ORDER — ONDANSETRON HCL 4 MG/2ML IJ SOLN: 4.0000 mg | Freq: Four times a day (QID) | INTRAMUSCULAR | Status: DC | PRN

## 2024-03-22 MED ORDER — ASPIRIN 81 MG PO CHEW
324.0000 mg | CHEWABLE_TABLET | Freq: Once | ORAL | Status: AC
  Administered 2024-03-22: 324 mg via ORAL
  Filled 2024-03-22: qty 4

## 2024-03-22 MED ORDER — SODIUM CHLORIDE 0.9 % IV SOLN: 250.0000 mL | INTRAVENOUS | Status: AC | PRN

## 2024-03-22 MED ORDER — ASPIRIN 81 MG PO TBEC
81.0000 mg | DELAYED_RELEASE_TABLET | Freq: Every day | ORAL | Status: DC
  Administered 2024-03-23: 81 mg via ORAL
  Filled 2024-03-22: qty 1

## 2024-03-22 MED ORDER — ENOXAPARIN SODIUM 40 MG/0.4ML IJ SOSY
40.0000 mg | PREFILLED_SYRINGE | INTRAMUSCULAR | Status: DC
  Administered 2024-03-22: 40 mg via SUBCUTANEOUS
  Filled 2024-03-22: qty 0.4

## 2024-03-22 MED ORDER — ADULT MULTIVITAMIN W/MINERALS CH: 1.0000 | ORAL_TABLET | Freq: Every day | ORAL | Status: DC

## 2024-03-22 MED ORDER — EPINEPHRINE 0.3 MG/0.3ML IJ SOAJ
INTRAMUSCULAR | Status: AC
  Filled 2024-03-22: qty 0.3

## 2024-03-22 MED ORDER — ACETAMINOPHEN 325 MG PO TABS: 650.0000 mg | ORAL_TABLET | Freq: Four times a day (QID) | ORAL | Status: DC | PRN

## 2024-03-22 MED ORDER — IOHEXOL 350 MG/ML SOLN
100.0000 mL | Freq: Once | INTRAVENOUS | Status: AC | PRN
  Administered 2024-03-22: 100 mL via INTRAVENOUS

## 2024-03-22 MED ORDER — LACTATED RINGERS IV SOLN: INTRAVENOUS | Status: AC

## 2024-03-22 MED ORDER — ACETAMINOPHEN 650 MG RE SUPP: 650.0000 mg | Freq: Four times a day (QID) | RECTAL | Status: DC | PRN

## 2024-03-22 MED ORDER — SODIUM CHLORIDE 0.9% FLUSH: 3.0000 mL | INTRAVENOUS | Status: DC | PRN

## 2024-03-22 MED ORDER — LATANOPROST 0.005 % OP SOLN
1.0000 [drp] | Freq: Every day | OPHTHALMIC | Status: DC
  Administered 2024-03-22: 1 [drp] via OPHTHALMIC
  Filled 2024-03-22: qty 2.5

## 2024-03-22 MED ORDER — LACTATED RINGERS IV BOLUS
500.0000 mL | Freq: Once | INTRAVENOUS | Status: AC
  Administered 2024-03-22: 500 mL via INTRAVENOUS

## 2024-03-22 NOTE — ED Provider Notes (Signed)
 [place] EMERGENCY DEPARTMENT AT [facility] Provider Note   CSN: [id number] Arrival date & time: 03/22/24  1128     Patient presents with: Chest Pain   Andrew Coffey is a 73 y.o. male.  {Add pertinent medical, surgical, social history, OB history to HPI:32947} HPI 73 yo male ho gerd, renal failure, anemia hypertension.  Patient was working out at [employer].  Began having chest pain and dyspnea about 1025 am. .  Pain was central and heavy, pressure. Pain was 5/10.  Lasted 1-1/2 hours.  Better sitting here and now gone.  No cardiac history. PMD  Recent cardiac ct- aneurysm.   PMD Dr. Charlott Finn Previous hypertension- now not on meds NO tobacco Drinks some on weekends    Prior to Admission medications   Medication Sig Start Date End Date Taking? Authorizing Provider  fluorouracil  (EFUDEX ) 5 % cream Apply topically 2 (two) times daily. 05/21/23   Magdalen Pasco RAMAN, DPM  ketorolac  (TORADOL ) 10 MG tablet Take 10 mg by mouth every 6 (six) hours as needed.    [provider]  latanoprost  (XALATAN ) 0.005 % ophthalmic solution Place 1 drop into both eyes at bedtime.    [provider]  Multiple Vitamin (MULTIVITAMIN WITH MINERALS) TABS tablet Take 1 tablet by mouth daily.    [provider]  PRESCRIPTION MEDICATION Hydrocodone prn for migraine, pt does not know dosage    [provider]  terbinafine  (LAMISIL ) 250 MG tablet Take 1 tablet (250 mg total) by mouth daily. 03/12/23   Magdalen Pasco RAMAN, DPM  timolol  (BETIMOL ) 0.5 % ophthalmic solution 1 drop 2 (two) times daily.    [provider]  timolol  (TIMOPTIC ) 0.5 % ophthalmic solution 1 drop 2 (two) times daily. 03/30/23   [provider]  VITAMIN D  PO Take 1 capsule by mouth daily.    [provider]    Allergies: Advil cold & sinus liqui-gels [pseudoephedrine-ibuprofen]    Review of Systems  Updated Vital Signs BP (!) 86/61 (BP Location: Left Arm)    Pulse 80   Temp 97.9 F (36.6 C) (Rectal)   Resp 16   Ht 1.778 m (5' 10)   Wt 74.8 kg   SpO2 99%   BMI 23.66 kg/m   Physical Exam Vitals reviewed.  Constitutional:      Appearance: He is well-developed.  HENT:     Head: Normocephalic.  Cardiovascular:     Rate and Rhythm: Normal rate and regular rhythm.     Pulses:          Carotid pulses are 1+ on the right side and 1+ on the left side.      Radial pulses are 1+ on the right side and 1+ on the left side.     Heart sounds: Normal heart sounds.  Pulmonary:     Effort: Pulmonary effort is normal.     Breath sounds: Normal breath sounds.  Abdominal:     General: Bowel sounds are normal.     Palpations: Abdomen is soft.  Musculoskeletal:        General: Normal range of motion.     Cervical back: Normal range of motion.  Skin:    General: Skin is warm and dry.     Capillary Refill: Capillary refill takes less than 2 seconds.  Neurological:     General: No focal deficit present.     Mental Status: He is alert.     (all labs ordered are listed,  but only abnormal results are displayed) Labs Reviewed  CBC  BASIC METABOLIC PANEL WITH GFR  TROPONIN T, HIGH SENSITIVITY    EKG: EKG Interpretation Date/Time:  Tuesday March 22 2024 11:42:55 EDT Ventricular Rate:  76 PR Interval:  198 QRS Duration:  109 QT Interval:  400 QTC Calculation: 450 R Axis:   -72  Text Interpretation: Sinus arrhythmia Incomplete RBBB and LAFB Abnormal R-wave progression, early transition Confirmed by Levander Houston [phone]) on 03/22/2024 11:50:58 AM  Radiology: [facility] Chest Port 1 View Result Date: 03/22/2024 EXAM: 1 VIEW XRAY OF THE CHEST 03/22/2024 11:47:00 AM COMPARISON: 03/18/2008 Normal. CLINICAL HISTORY: Pt reports with center chest pain and shob after working out today. Pt states that he can't seem to catch his breath. FINDINGS: LUNGS AND PLEURA: No focal pulmonary opacity. No pulmonary edema. No pleural effusion. No pneumothorax. HEART AND  MEDIASTINUM: No acute abnormality of the cardiac and mediastinal silhouettes. BONES AND SOFT TISSUES: No acute osseous abnormality. IMPRESSION: 1. No acute process. Electronically signed by: Waddell Calk MD 03/22/2024 12:03 PM EDT RP Workstation: [id number]    {Document cardiac monitor, telemetry assessment procedure when appropriate:32947} Procedures   Medications Ordered in the ED - No data to display  Clinical Course as of 03/22/24 1354  Tue Mar 22, 2024  1252 Chest x-Dwane Andres reviewed and interpreted with no acute abnormality radiologist interpretation concurs  [DR]  1344 Bmet reviewed interpreted significant for creatinine elevated to 1.46 with first prior 0.93 years ago First troponin T elevated at 30 [DR]  1344 Chest x-Nghia [place] reviewed interpreted no evidence of acute abnormalities noted [DR]    Clinical Course User Index [DR] Levander Houston, MD   {Click here for ABCD2, HEART and other calculators REFRESH Note before signing:1}                              Medical Decision Making Amount and/or Complexity of Data Reviewed Labs: ordered. Radiology: ordered.  Risk OTC drugs.   73 year old male with substernal chest pain that began while working out today.  Has since resolved.  EKG without acutely ischemic changes.  First troponin is 30.  Patient continues to be pain-free. Care discussed with Dr. Lonni, on-call for cardiology.  Plan await second troponin to determine whether or not patient will need urgent catheterization.  However, as he remains pain-free he appears to be stable for admission to hospitalist service here Patient had CTA done in July.  Patient had trace aneurysmal dilation of the aortic root noted on that study. Considered that chest pain could come from thoracic aneurysm versus CAD.  EKG without acute ST changes.  First troponin is elevated at 30. Discussed with Dr. Venita.  Patient has increased creatinine at 1.47.  Plan to discuss again with cardiology after  second troponin results.  If troponin is elevated, patient may need catheterization could have aorta evaluated.  If patient not having cardiac catheterization, would then proceed with CTA to evaluate aorta here.  {Document critical care time when appropriate  Document review of labs and clinical decision tools ie CHADS2VASC2, etc  Document your independent review of radiology images and any outside records  Document your discussion with family members, caretakers and with consultants  Document social determinants of health affecting pt's care  Document your decision making why or why not admission, treatments were needed:32947:::1}   Final diagnoses:  None    ED Discharge Orders     None

## 2024-03-22 NOTE — ED Triage Notes (Signed)
 Pt reports with center chest pain and shob after working out today. Pt states that he can't seem to catch his breath.

## 2024-03-22 NOTE — ED Notes (Signed)
 Multiple attempts made to alert staff that pt was being transported to the floor

## 2024-03-22 NOTE — H&P (Addendum)
 History and Physical    Patient: Andrew Coffey FMW:[id number] DOB: May 23, 1951 DOA: 03/22/2024 DOS: the patient was seen and examined on 03/22/2024 PCP: [name] LABOR, MD  Patient coming from: Home  Chief Complaint:  Chief Complaint  Patient presents with   Chest Pain   HPI: Andrew Coffey is a 73 y.o. 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  Patient was pushing a sled when he suddenly developed chest pain.  Described pain pressure-like, 5 out of 10, associated with shallow breathing, lasted 40 minutes.  Pain got better when he arrived to the ED.  He also noticed blurry vision while he was driving here to the ED.  When he was at the admission desk he felt generalized weakness and had trouble standing they help him to the wheelchair.  He then denies any focal weakness, blurry vision has resolved.  His initial blood pressure in the ED was 86/61.  Chest pain has resolved.  He denies any chest pain or shortness of breath on exertion prior to these episodes.  He was recently started on a statin for elevated cholesterol level.  He takes a baby aspirin  daily. He denies abdominal pain, nausea, vomiting, diarrhea.  Patient in the ED; sodium 142, potassium 4.2, CO2 21, BUN 17, creatinine 1.4, troponin 30, hemoglobin 13, white blood cell 6.5, platelets 164.  EKG incomplete bundle branch block, chest x-ray: No acute process  Case was discussed with cardiology, plan is to wait for second troponin level to determine if patient is continuing heart cath.  Cardiology will consult   Review of Systems: As mentioned in the history of present illness. All other systems reviewed and are negative. Past Medical History:  Diagnosis Date   Borderline  glaucoma    BPH (benign prostatic hypertrophy)    Cancer (HCC)    basal cell carcinoma   GERD (gastroesophageal reflux disease)    History of acute renal failure    adx 10-05-2014  due to bilateral hydronephrosis   History of basal cell carcinoma excision    X2 FACE   History of colon polyps    2001   History of esophageal dilatation    severel times for tortuous esophagus   History of kidney stones    history of urinary retenion    Left nephrolithiasis    Migraine    probably 3 times/yr (10/04/2014)   Renal cyst, left    Wears glasses    Past Surgical History:  Procedure Laterality Date   CATARACT EXTRACTION W/ INTRAOCULAR LENS IMPLANT Right 07/14/2010   COLONOSCOPY W/ POLYPECTOMY  07/15/1999   CYSTOSCOPY/URETEROSCOPY/HOLMIUM LASER/STENT PLACEMENT Left 03/20/2022   Procedure: CYSTOSCOPY/LEFT URETEROSCOPY/HOLMIUM LASER/STONE EXTRACTION/ LEFT URETERAL STENT PLACEMENT;  Surgeon: Cam Morene ORN, MD;  Location: [facility];  Service: Urology;  Laterality: Left;  100 MINUTES NEEDED   ESOPHAGOGASTRODUODENOSCOPY  10/22/2011   Procedure: ESOPHAGOGASTRODUODENOSCOPY (EGD);  Surgeon: Elsie Cree, MD;  Location: [facility];  Service: Endoscopy;  Laterality: N/A;   ESOPHAGOGASTRODUODENOSCOPY  11/21/2011   Procedure: ESOPHAGOGASTRODUODENOSCOPY (EGD);  Surgeon: Oliva FORBES Boots, MD;  Location: [facility] ENDOSCOPY;  Service: Endoscopy;  Laterality: N/A;  have balloons available.    EXTRACORPOREAL SHOCK WAVE LITHOTRIPSY  07/14/2000   INGUINAL HERNIA REPAIR Right 01/24/2021   Procedure: LAPAROSCOPIC RIGHT INGUINAL  AND UMBILICAL HERNIA REPAIR WITH MESH;  Surgeon: Rubin Calamity, MD;  Location: [facility];  Service: General;  Laterality: Right;   LAPAROSCOPIC PARTIAL NEPHRECTOMY Left 01/24/2021   Procedure: LAPAROSCOPIC LEFT RENAL CYST MARSUPIALIZATION;  Surgeon: Cam Morene ORN, MD;  Location: [facility];  Service: Urology;  Laterality: Left;   MOHS SURGERY  07/14/2012   nose   PARS  PLANA VITRECTOMY W/ REPAIR OF MACULAR HOLE Right 08/14/2009   SAVORY DILATION  11/21/2011   Procedure: SAVORY DILATION;  Surgeon: Oliva FORBES Boots, MD;  Location[facility];  Service: Endoscopy;  Laterality: N/A;   TONSILLECTOMY  ~ 1960   TRANSURETHRAL RESECTION OF PROSTATE N/A 11/01/2014   Procedure: TRANSURETHRAL RESECTION OF THE PROSTATE WITH GYRUS INSTRUMENTS;  Surgeon: Alm Fragmin, MD;  Location: [facility];  Service: Urology;  Laterality: N/A;   VENTRAL HERNIA REPAIR N/A 01/24/2021   Procedure: LAPAROSCOPIC VENTRAL HERNIA;  Surgeon: Rubin Calamity, MD;  Location: [facility];  Service: General;  Laterality: N/A;   Social History:  reports that he has never smoked. He has never used smokeless tobacco. He reports current alcohol  use of about 4.0 standard drinks of alcohol  per week. He reports that he does not use drugs.  Allergies  Allergen Reactions   Advil Cold & Sinus Liqui-Gels [Pseudoephedrine-Ibuprofen]     Unrinary retention     Family History  Problem Relation Age of Onset   Thrombocytopenia Mother        died of ITP   Heart attack Father    Thyroid  disease Sister     Prior to Admission medications   Medication Sig Start Date End Date Taking? Authorizing Provider  fluorouracil  (EFUDEX ) 5 % cream Apply topically 2 (two) times daily. 05/21/23   Magdalen Pasco RAMAN, DPM  ketorolac  (TORADOL ) 10 MG tablet Take 10 mg by mouth every 6 (six) hours as needed.    [provider]  latanoprost  (XALATAN ) 0.005 % ophthalmic solution Place 1 drop into both eyes at bedtime.    [provider]  Multiple Vitamin (MULTIVITAMIN WITH MINERALS) TABS tablet Take 1 tablet by mouth daily.    [provider]  PRESCRIPTION MEDICATION Hydrocodone prn for migraine, pt does not know dosage    [provider]  terbinafine  (LAMISIL ) 250 MG tablet Take 1 tablet (250 mg total) by mouth daily. 03/12/23   Magdalen Pasco RAMAN, DPM  timolol  (BETIMOL ) 0.5 % ophthalmic  solution 1 drop 2 (two) times daily.    [provider]  timolol  (TIMOPTIC ) 0.5 % ophthalmic solution 1 drop 2 (two) times daily. 03/30/23   [provider]  VITAMIN D  PO Take 1 capsule by mouth daily.    [provider]    Physical Exam: Vitals:   03/22/24 1135 03/22/24 1157 03/22/24 1230 03/22/24 1300  BP:  (!) 86/61 95/68 110/83  Pulse:  80 78 75  Resp:  16 19 16   Temp:  97.9 F (36.6 C)    TempSrc:  Rectal    SpO2:  99% 98% 99%  Weight: 74.8 kg     Height: 5' 10 (1.778 m)      General; alert in no distress CVS: S1-S2 regular rhythm and rate Lungs: Normal respiratory effort, clear to auscultation Abdomen: Bowel sounds present, soft nontender nondistended no guarding Extremities: No edema Neurology: Alert and conversant speech is clear, follows command, motor strength 5 out of 5   Data Reviewed:  Labs reviewed.   Assessment and Plan: No notes have been filed under  this hospital service. Service: Hospitalist  1-Chest pain, after strenuous exercise; He is currently chest pain-free.  Initial troponin mildly elevated at 30, had incomplete bundle branch block on EKG. cardiology has been consulted.  Plan to await for second troponin to determine if patient will need a heart cath.  He does have a history of aortic dilation, he is currently chest pain-free.  May need CT angiography, if he  does not require heart cath. Check A1c and lipid panel Continue baby aspirin  Check ECHO  2-Hypotension; Suspect hypovolemia. Responded to IV fluids.  ECHO ordered.  Blurry vision and generalized weakness related to hypotension. Resolved. Neuro exam non focal.   Glaucoma:  Resume Xalatan . Await med rec to resume timolol .    Advance Care Planning:   Code Status: Prior full code  Consults: Cardiology   Family Communication:   Severity of Illness: The appropriate patient status for this patient is OBSERVATION. Observation status is judged to be reasonable and  necessary in order to provide the required intensity of service to ensure the patient's safety. The patient's presenting symptoms, physical exam findings, and initial radiographic and laboratory data in the context of their medical condition is felt to place them at decreased risk for further clinical deterioration. Furthermore, it is anticipated that the patient will be medically stable for discharge from the hospital within 2 midnights of admission.   Author: Owen DELENA Lore, MD 03/22/2024 2:17 PM  For on call review [email or web address].

## 2024-03-22 NOTE — ED Notes (Signed)
 Pt stated that he is feeling better.  4 times to get a temperature on pt.  Had to get the rectal per nurse.

## 2024-03-22 NOTE — ED Notes (Signed)
 Lab called to add on Lipid panel and Hgb A1c

## 2024-03-23 ENCOUNTER — Encounter (HOSPITAL_COMMUNITY)
Admission: EM | Disposition: A | Discharge status: Home / Self Care | Payer: Self-pay | Source: Home / Self Care | Attending: Emergency Medicine

## 2024-03-23 ENCOUNTER — Encounter (HOSPITAL_COMMUNITY): Payer: Self-pay | Admitting: Cardiology

## 2024-03-23 ENCOUNTER — Observation Stay (HOSPITAL_COMMUNITY)

## 2024-03-23 DIAGNOSIS — I77810 Thoracic aortic ectasia: Secondary | ICD-10-CM | POA: Diagnosis present

## 2024-03-23 DIAGNOSIS — R079 Chest pain, unspecified: Secondary | ICD-10-CM | POA: Diagnosis not present

## 2024-03-23 DIAGNOSIS — R931 Abnormal findings on diagnostic imaging of heart and coronary circulation: Secondary | ICD-10-CM

## 2024-03-23 DIAGNOSIS — I214 Non-ST elevation (NSTEMI) myocardial infarction: Principal | ICD-10-CM

## 2024-03-23 DIAGNOSIS — E861 Hypovolemia: Secondary | ICD-10-CM

## 2024-03-23 DIAGNOSIS — H409 Unspecified glaucoma: Secondary | ICD-10-CM

## 2024-03-23 DIAGNOSIS — I959 Hypotension, unspecified: Secondary | ICD-10-CM | POA: Insufficient documentation

## 2024-03-23 DIAGNOSIS — E7800 Pure hypercholesterolemia, unspecified: Secondary | ICD-10-CM | POA: Diagnosis not present

## 2024-03-23 DIAGNOSIS — R072 Precordial pain: Secondary | ICD-10-CM | POA: Diagnosis not present

## 2024-03-23 HISTORY — PX: LEFT HEART CATH AND CORONARY ANGIOGRAPHY: CATH118249

## 2024-03-23 LAB — ECHOCARDIOGRAM COMPLETE
Area-P 1/2: 3.01 cm2
Height: 70 in
S' Lateral: 3.4 cm
Weight: 2638.47 [oz_av]

## 2024-03-23 LAB — COMPREHENSIVE METABOLIC PANEL WITH GFR
ALT: 19 U/L (ref 0–44)
AST: 23 U/L (ref 15–41)
Albumin: 3.5 g/dL (ref 3.5–5.0)
Alkaline Phosphatase: 40 U/L (ref 38–126)
Anion gap: 14 (ref 5–15)
BUN: 19 mg/dL (ref 8–23)
CO2: 21 mmol/L — ABNORMAL LOW (ref 22–32)
Calcium: 9.1 mg/dL (ref 8.9–10.3)
Chloride: 108 mmol/L (ref 98–111)
Creatinine, Ser: 1.14 mg/dL (ref 0.61–1.24)
GFR, Estimated: >60 mL/min (ref >60)
Glucose, Bld: 93 mg/dL (ref 70–99)
Potassium: 3.5 mmol/L (ref 3.5–5.1)
Sodium: 142 mmol/L (ref 135–145)
Total Bilirubin: 0.5 mg/dL (ref 0.0–1.2)
Total Protein: 7.1 g/dL (ref 6.5–8.1)

## 2024-03-23 LAB — CBC
HCT: 37.6 % — ABNORMAL LOW (ref 39.0–52.0)
Hemoglobin: 11.9 g/dL — ABNORMAL LOW (ref 13.0–17.0)
MCH: 29.5 pg (ref 26.0–34.0)
MCHC: 31.6 g/dL (ref 30.0–36.0)
MCV: 93.3 fL (ref 80.0–100.0)
Platelets: 150 K/uL (ref 150–400)
RBC: 4.03 MIL/uL — ABNORMAL LOW (ref 4.22–5.81)
RDW: 13.6 % (ref 11.5–15.5)
WBC: 6.3 K/uL (ref 4.0–10.5)
nRBC: 0 % (ref 0.0–0.2)

## 2024-03-23 LAB — GLUCOSE, CAPILLARY: Glucose-Capillary: 110 mg/dL — ABNORMAL HIGH (ref 70–99)

## 2024-03-23 LAB — MAGNESIUM: Magnesium: 2 mg/dL (ref 1.7–2.4)

## 2024-03-23 SURGERY — LEFT HEART CATH AND CORONARY ANGIOGRAPHY
Anesthesia: LOCAL

## 2024-03-23 MED ORDER — LIDOCAINE HCL (PF) 1 % IJ SOLN
INTRAMUSCULAR | Status: AC
  Filled 2024-03-23: qty 30

## 2024-03-23 MED ORDER — POTASSIUM CHLORIDE CRYS ER 10 MEQ PO TBCR: 40.0000 meq | EXTENDED_RELEASE_TABLET | Freq: Once | ORAL | Status: DC

## 2024-03-23 MED ORDER — HEPARIN (PORCINE) 25000 UT/250ML-% IV SOLN
1000.0000 [IU]/h | INTRAVENOUS | Status: DC
  Administered 2024-03-23: 1000 [IU]/h via INTRAVENOUS
  Filled 2024-03-23: qty 250

## 2024-03-23 MED ORDER — VERAPAMIL HCL 2.5 MG/ML IV SOLN
INTRAVENOUS | Status: DC | PRN
  Administered 2024-03-23: 10 mL via INTRA_ARTERIAL

## 2024-03-23 MED ORDER — FENTANYL CITRATE (PF) 100 MCG/2ML IJ SOLN
INTRAMUSCULAR | Status: DC | PRN
  Administered 2024-03-23: 50 ug via INTRAVENOUS

## 2024-03-23 MED ORDER — ROSUVASTATIN CALCIUM 5 MG PO TABS: 5.0000 mg | ORAL_TABLET | Freq: Every day | ORAL | Status: DC

## 2024-03-23 MED ORDER — TIMOLOL MALEATE 0.5 % OP SOLN
1.0000 [drp] | Freq: Two times a day (BID) | OPHTHALMIC | Status: DC
  Administered 2024-03-23: 1 [drp] via OPHTHALMIC
  Filled 2024-03-23: qty 5

## 2024-03-23 MED ORDER — HEPARIN (PORCINE) IN NACL 1000-0.9 UT/500ML-% IV SOLN
INTRAVENOUS | Status: DC | PRN
  Administered 2024-03-23 (×2): 500 mL

## 2024-03-23 MED ORDER — MIDAZOLAM HCL 2 MG/2ML IJ SOLN
INTRAMUSCULAR | Status: DC | PRN
  Administered 2024-03-23: 2 mg via INTRAVENOUS

## 2024-03-23 MED ORDER — LIDOCAINE HCL (PF) 1 % IJ SOLN
INTRAMUSCULAR | Status: DC | PRN
  Administered 2024-03-23: 2 mL via INTRADERMAL

## 2024-03-23 MED ORDER — HEPARIN BOLUS VIA INFUSION
4000.0000 [IU] | Freq: Once | INTRAVENOUS | Status: AC
  Administered 2024-03-23: 4000 [IU] via INTRAVENOUS
  Filled 2024-03-23: qty 4000

## 2024-03-23 MED ORDER — FENTANYL CITRATE (PF) 100 MCG/2ML IJ SOLN
INTRAMUSCULAR | Status: AC
  Filled 2024-03-23: qty 2

## 2024-03-23 MED ORDER — HEPARIN SODIUM (PORCINE) 1000 UNIT/ML IJ SOLN
INTRAMUSCULAR | Status: AC
  Filled 2024-03-23: qty 10

## 2024-03-23 MED ORDER — FREE WATER
500.0000 mL | Freq: Once | Status: AC
  Administered 2024-03-23: 500 mL via ORAL

## 2024-03-23 MED ORDER — VITAMIN D 25 MCG (1000 UNIT) PO TABS
1000.0000 [IU] | ORAL_TABLET | Freq: Every day | ORAL | Status: DC
Start: 2024-03-23 — End: 2024-03-24

## 2024-03-23 MED ORDER — SODIUM CHLORIDE 0.9% FLUSH: 3.0000 mL | INTRAVENOUS | Status: DC | PRN

## 2024-03-23 MED ORDER — HEPARIN SODIUM (PORCINE) 1000 UNIT/ML IJ SOLN
INTRAMUSCULAR | Status: DC | PRN
  Administered 2024-03-23: 4000 [IU] via INTRAVENOUS

## 2024-03-23 MED ORDER — SODIUM CHLORIDE 0.9 % IV SOLN: INTRAVENOUS | Status: DC

## 2024-03-23 MED ORDER — MIDAZOLAM HCL 2 MG/2ML IJ SOLN
INTRAMUSCULAR | Status: AC
  Filled 2024-03-23: qty 2

## 2024-03-23 MED ORDER — FREE WATER: 500.0000 mL | Freq: Once | Status: DC

## 2024-03-23 MED ORDER — ASPIRIN 81 MG PO CHEW
81.0000 mg | CHEWABLE_TABLET | ORAL | Status: DC
Start: 2024-03-24 — End: 2024-03-25

## 2024-03-23 MED ORDER — SODIUM CHLORIDE 0.9% FLUSH: 3.0000 mL | Freq: Two times a day (BID) | INTRAVENOUS | Status: DC

## 2024-03-23 MED ORDER — HYDROCORTISONE ACETATE 25 MG RE SUPP: 25.0000 mg | Freq: Every day | RECTAL | Status: DC | PRN

## 2024-03-23 MED ORDER — ROSUVASTATIN CALCIUM 10 MG PO TABS: 10.0000 mg | ORAL_TABLET | Freq: Every day | ORAL | 0 refills | Status: AC

## 2024-03-23 MED ORDER — VERAPAMIL HCL 2.5 MG/ML IV SOLN
INTRAVENOUS | Status: AC
  Filled 2024-03-23: qty 2

## 2024-03-23 MED ORDER — SODIUM CHLORIDE 0.9 % IV SOLN: 250.0000 mL | INTRAVENOUS | Status: DC | PRN

## 2024-03-23 MED ORDER — IOHEXOL 350 MG/ML SOLN
INTRAVENOUS | Status: DC | PRN
  Administered 2024-03-23: 40 mL

## 2024-03-23 SURGICAL SUPPLY — 8 items
CATH INFINITI AMBI 5FR TG (CATHETERS) IMPLANT
CATH INFINITI JR4 5F (CATHETERS) IMPLANT
DEVICE RAD COMP TR BAND LRG (VASCULAR PRODUCTS) IMPLANT
GLIDESHEATH SLEND A-KIT 6F 22G (SHEATH) IMPLANT
GUIDEWIRE INQWIRE 1.5J.035X260 (WIRE) IMPLANT
KIT SYRINGE INJ CVI SPIKEX1 (MISCELLANEOUS) IMPLANT
PACK CARDIAC CATHETERIZATION (CUSTOM PROCEDURE TRAY) ×1 IMPLANT
SET ATX-X65L (MISCELLANEOUS) IMPLANT

## 2024-03-23 NOTE — Progress Notes (Addendum)
 PROGRESS NOTE    Andrew Coffey  FMW:[id number] DOB: 09-12-1950 DOA: 03/22/2024 PCP: [name] LABOR, MD   Chief Complaint  Patient presents with   Chest Pain    Brief Narrative:  Patient pleasant 73 year old gentleman history of left ureteral stone, BPH, GERD, history of esophageal dilation several times, trace aneurysmal dilation of the ascending thoracic aorta with a maximal diameter of 4 cm presenting with complaints of chest pain after he was working out with a Psychologist, educational.  Chest pain described as a pressure-like sensation.  Patient noted to have elevated troponins.  Patient with multiple cardiac risk factors.  Patient seen in consultation by cardiology who are recommending cardiac catheterization for further evaluation.   Assessment & Plan:   Principal Problem:   Chest pain Active Problems:   Non-ST elevation (NSTEMI) myocardial infarction (HCC)   Agatston coronary artery calcium  score greater than 400   Pure hypercholesterolemia   Ascending aorta dilatation (HCC)   Hypotension   Glaucoma  #1 chest pain after strenuous exercise/non-STEMI -Patient presenting with chest pain after strenuous exercises with his trainer. - Chest pain described as more pressure sensation which improved after presentation to the ED. - CT angiogram chest abdomen and pelvis negative for PE, no evidence of thoracoabdominal aortic dissection, 4.2 cm ascending thoracic aortic aneurysm. - Cardiac enzymes noted at 30>>> 61>>> 102>>> 94. - EKG noted with incomplete right bundle branch block. - Hemoglobin A1c 5.4. - Fasting lipid panel with total cholesterol of 114, LDL of 53, triglycerides of 59. -2D echo with a EF of 60 to 65%,NWMA, G1 DD, mildly dilated right atrial size, mild dilation of ascending aorta measuring 44 mm. -Patient was started on heparin  drip. -Resume home regimen Crestor . - Patient seen in consultation by cardiology and due to multiple cardiac risk factors of strong family history  of premature CAD, hyperlipidemia controlled on Crestor , cardiology recommending cardiac catheterization for further evaluation and management. - Per cardiology.  2.  Hyperlipidemia -LDL of 53. -Discontinue Lipitor and resume home regimen Crestor .  3.-Hypotension -Patient noted on admission initially to be hypotensive which was felt to be secondary to hypovolemia which resolved with IV fluids. -2D echo as noted above.  4.  4.2 cm ascending thoracic aortic aneurysm -Noted on CT angiogram chest, patient with history of ascending thoracic aortic aneurysm. - Abnormal imaging follow-up by CT or MRA recommended. - Outpatient follow-up with PCP and vascular surgery.  5.  Glaucoma -Continue Xalatan . - Resume home regimen timolol  eyedrops.   DVT prophylaxis: Heparin  drip Code Status: Full Family Communication: Updated patient and wife at bedside. Disposition: Likely home when clinically improved and cleared by cardiology.  Status is: Observation The patient remains OBS appropriate and will d/c before 2 midnights.   Consultants:  Cardiology: Dr. Shlomo 04/02/2024  Procedures:  CT angiogram chest abdomen and pelvis 03/22/2024 Chest x-ray 03/22/2024 2D echo 03/23/2024  Antimicrobials:  Anti-infectives (From admission, onward)    None         Subjective: Patient sitting up in bed.  States had some minimal left-sided chest pain but nothing significant.  Denies any shortness of breath.  No abdominal pain.  Stated he saw the cardiologist and are planning on transferring him to [facility] today for cardiac catheterization.  Objective: Vitals:   03/23/24 0137 03/23/24 0523 03/23/24 0930 03/23/24 1240  BP: 119/75 125/84 139/85 136/84  Pulse: (!) 59 (!) 53 69 (!) 59  Resp: 14 16  18   Temp: 99.1 F (37.3 C) 98.8 F (37.1  C) 98.2 F (36.8 C) 98.5 F (36.9 C)  TempSrc: Oral Oral Oral Oral  SpO2: 99% 96% 99% 96%  Weight:      Height:        Intake/Output Summary (Last 24 hours) at  03/23/2024 1547 Last data filed at 03/23/2024 1532 Gross per 24 hour  Intake 504.25 ml  Output --  Net 504.25 ml   Filed Weights   03/22/24 1135  Weight: 74.8 kg    Examination:  General exam: Appears calm and comfortable  Respiratory system: Clear to auscultation. Respiratory effort normal. Cardiovascular system: S1 & S2 heard, RRR. No JVD, murmurs, rubs, gallops or clicks. No pedal edema. Gastrointestinal system: Abdomen is nondistended, soft and nontender. No organomegaly or masses felt. Normal bowel sounds heard. Central nervous system: Alert and oriented. No focal neurological deficits. Extremities: Symmetric 5 x 5 power. Skin: No rashes, lesions or ulcers Psychiatry: Judgement and insight appear normal. Mood & affect appropriate.     Data Reviewed: I have personally reviewed following labs and imaging studies  CBC: Recent Labs  Lab 03/22/24 1134 03/23/24 0408  WBC 6.5 6.3  HGB 13.5 11.9*  HCT 42.6 37.6*  MCV 93.2 93.3  PLT 164 150    Basic Metabolic Panel: Recent Labs  Lab 03/22/24 1134 03/23/24 0408  NA 142 142  K 4.2 3.5  CL 103 108  CO2 21* 21*  GLUCOSE 112* 93  BUN 17 19  CREATININE 1.46* 1.14  CALCIUM  10.0 9.1  MG  --  2.0    GFR: Estimated Creatinine Clearance: 59.6 mL/min (by C-G formula based on SCr of 1.14 mg/dL).  Liver Function Tests: Recent Labs  Lab 03/23/24 0408  AST 23  ALT 19  ALKPHOS 40  BILITOT 0.5  PROT 7.1  ALBUMIN  3.5    CBG: No results for input(s): GLUCAP in the last 168 hours.   No results found for this or any previous visit (from the past 240 hours).       Radiology Studies: ECHOCARDIOGRAM COMPLETE Result Date: 03/23/2024    ECHOCARDIOGRAM REPORT   Patient Name:   Andrew Coffey Date of Exam: 03/23/2024 Medical Rec #:  [id number]       Height:       70.0 in Accession #:    [id number]      Weight:       164.9 lb Date of Birth:  [birth year]       BSA:          1.923 m Patient Age:    73 years        BP:            136/84 mmHg Patient Gender: M               HR:           54 bpm. Exam Location:  Inpatient Procedure: 2D Echo, Color Doppler and Cardiac Doppler (Both Spectral and Color            Flow Doppler were utilized during procedure). Indications:    Chest Pain R07.9  History:        Patient has no prior history of Echocardiogram examinations.  Sonographer:    Tinnie Gosling RDCS Referring Phys: [phone] BELKYS A REGALADO IMPRESSIONS  1. Left ventricular ejection fraction, by estimation, is 60 to 65%. The left ventricle has normal function. The left ventricle has no regional wall motion abnormalities. There is mild left ventricular hypertrophy. Left ventricular diastolic parameters are consistent  with Grade I diastolic dysfunction (impaired relaxation).  2. Right ventricular systolic function is normal. The right ventricular size is normal. There is normal pulmonary artery systolic pressure. The estimated right ventricular systolic pressure is 17.3 mmHg.  3. Right atrial size was mildly dilated.  4. The mitral valve is grossly normal. Trivial mitral valve regurgitation.  5. The aortic valve is tricuspid. Aortic valve regurgitation is not visualized. Aortic valve sclerosis is present, with no evidence of aortic valve stenosis.  6. Aortic dilatation noted. There is mild dilatation of the ascending aorta, measuring 44 mm.  7. The inferior vena cava is normal in size with greater than 50% respiratory variability, suggesting right atrial pressure of 3 mmHg. Comparison(s): No prior Echocardiogram. FINDINGS  Left Ventricle: Left ventricular ejection fraction, by estimation, is 60 to 65%. The left ventricle has normal function. The left ventricle has no regional wall motion abnormalities. The left ventricular internal cavity size was normal in size. There is  mild left ventricular hypertrophy. Left ventricular diastolic parameters are consistent with Grade I diastolic dysfunction (impaired relaxation). Indeterminate filling  pressures. Right Ventricle: The right ventricular size is normal. No increase in right ventricular wall thickness. Right ventricular systolic function is normal. There is normal pulmonary artery systolic pressure. The tricuspid regurgitant velocity is 1.89 m/s, and  with an assumed right atrial pressure of 3 mmHg, the estimated right ventricular systolic pressure is 17.3 mmHg. Left Atrium: Left atrial size was normal in size. Right Atrium: Right atrial size was mildly dilated. Pericardium: There is no evidence of pericardial effusion. Mitral Valve: The mitral valve is grossly normal. Trivial mitral valve regurgitation. Tricuspid Valve: The tricuspid valve is grossly normal. Tricuspid valve regurgitation is trivial. Aortic Valve: The aortic valve is tricuspid. Aortic valve regurgitation is not visualized. Aortic valve sclerosis is present, with no evidence of aortic valve stenosis. Pulmonic Valve: The pulmonic valve was grossly normal. Pulmonic valve regurgitation is trivial. Aorta: Aortic dilatation noted. There is mild dilatation of the ascending aorta, measuring 44 mm. Venous: The inferior vena cava is normal in size with greater than 50% respiratory variability, suggesting right atrial pressure of 3 mmHg. IAS/Shunts: No atrial level shunt detected by color flow Doppler.  LEFT VENTRICLE PLAX 2D LVIDd:         4.80 cm   Diastology LVIDs:         3.40 cm   LV e' medial:    6.96 cm/s LV PW:         1.10 cm   LV E/e' medial:  9.9 LV IVS:        1.10 cm   LV e' lateral:   7.94 cm/s LVOT diam:     2.30 cm   LV E/e' lateral: 8.7 LV SV:         87 LV SV Index:   45 LVOT Area:     4.15 cm  RIGHT VENTRICLE             IVC RV S prime:     11.70 cm/s  IVC diam: 1.80 cm TAPSE (M-mode): 1.5 cm LEFT ATRIUM             Index        RIGHT ATRIUM           Index LA diam:        3.90 cm 2.03 cm/m   RA Area:     19.80 cm LA Vol (A2C):   52.3 ml 27.20 ml/m  RA Volume:  52.30 ml  27.20 ml/m LA Vol (A4C):   45.7 ml 23.77 ml/m LA  Biplane Vol: 50.2 ml 26.11 ml/m  AORTIC VALVE LVOT Vmax:   99.00 cm/s LVOT Vmean:  72.700 cm/s LVOT VTI:    0.210 m  AORTA Ao Root diam: 3.70 cm Ao Asc diam:  4.50 cm MITRAL VALVE               TRICUSPID VALVE MV Area (PHT): 3.01 cm    TR Peak grad:   14.3 mmHg MV Decel Time: 252 msec    TR Vmax:        189.00 cm/s MV E velocity: 69.00 cm/s MV A velocity: 80.60 cm/s  SHUNTS MV E/A ratio:  0.86        Systemic VTI:  0.21 m                            Systemic Diam: 2.30 cm Vinie Maxcy MD Electronically signed by Vinie Maxcy MD Signature Date/Time: 03/23/2024/2:44:35 PM    Final    CT Angio Chest/Abd/Pel for Dissection W and/or W/WO Result Date: 03/22/2024 CLINICAL DATA:  Central chest pain and shortness of breath after working out today EXAM: CT ANGIOGRAPHY CHEST, ABDOMEN AND PELVIS TECHNIQUE: Non-contrast CT of the chest was initially obtained. Multidetector CT imaging through the chest, abdomen and pelvis was performed using the standard protocol during bolus administration of intravenous contrast. Multiplanar reconstructed images and MIPs were obtained and reviewed to evaluate the vascular anatomy. RADIATION DOSE REDUCTION: This exam was performed according to the departmental dose-optimization program which includes automated exposure control, adjustment of the mA and/or kV according to patient size and/or use of iterative reconstruction technique. CONTRAST:  OMNIPAQUE  IOHEXOL  350 MG/ML SOLN COMPARISON:  02/11/2024 FINDINGS: CTA CHEST FINDINGS Cardiovascular: 4.2 cm ascending thoracic aortic aneurysm. No evidence of aortic dissection. There is technically adequate opacification of the pulmonary vasculature. No filling defects or pulmonary emboli. The heart is unremarkable without pericardial effusion. Coronary artery atherosclerosis most pronounced in the LAD distribution. Mediastinum/Nodes: No enlarged mediastinal, hilar, or axillary lymph nodes. Thyroid  gland, trachea, and esophagus demonstrate no  significant findings. Lungs/Pleura: Bibasilar hypoventilatory changes. No acute airspace disease, effusion, or pneumothorax. Central airways are patent. Musculoskeletal: No acute or destructive bony abnormalities. Reconstructed images demonstrate no additional findings. Review of the MIP images confirms the above findings. CTA ABDOMEN AND PELVIS FINDINGS VASCULAR Aorta: Normal caliber aorta without aneurysm, dissection, vasculitis or significant stenosis. Mild atherosclerosis. Celiac: Patent without evidence of aneurysm, dissection, vasculitis or significant stenosis. SMA: Patent without evidence of aneurysm, dissection, vasculitis or significant stenosis. Renals: There are 2 right renal arteries and a single left renal artery. Bilateral renal arteries are patent without evidence of aneurysm, dissection, vasculitis, fibromuscular dysplasia, or significant stenosis. IMA: Patent without evidence of aneurysm, dissection, vasculitis or significant stenosis. Inflow: Patent without evidence of aneurysm, dissection, vasculitis or significant stenosis. Veins: No obvious venous abnormality within the limitations of this arterial phase study. Review of the MIP images confirms the above findings. NON-VASCULAR Hepatobiliary: No focal liver abnormality is seen. No gallstones, gallbladder wall thickening, or biliary dilatation. Pancreas: Unremarkable. No pancreatic ductal dilatation or surrounding inflammatory changes. Spleen: Normal in size without focal abnormality. Adrenals/Urinary Tract: There are bilateral simple appearing renal cortical and peripelvic cysts. There are 2 nonobstructing 5 mm left renal calculi. No right-sided calculi. No obstructive uropathy within either kidney. The adrenals are unremarkable. Stomach/Bowel: No bowel obstruction or ileus. Normal  appendix right lower quadrant. Diffuse colonic diverticulosis without diverticulitis. No bowel wall thickening or inflammatory change. Lymphatic: No pathologic  adenopathy. Reproductive: Prostate is enlarged, with TURP defect identified. Other: No free fluid or free intraperitoneal gas. No abdominal wall hernia. Musculoskeletal: No acute or destructive bony abnormalities. Distended fluid-filled left iliopsoas bursa. Reconstructed images demonstrate no additional findings. Review of the MIP images confirms the above findings. IMPRESSION: Vascular: 1. 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 Recommend annual imaging followup by CTA or MRA. This recommendation follows 2010 ACCF/AHA/AATS/ACR/ASA/SCA/SCAI/SIR/STS/SVM Guidelines for the Diagnosis and Management of Patients with Thoracic Aortic Disease. Circulation. 2010; 121: Z733-z630. Aortic aneurysm NOS (ICD10-I71.9) 2. No evidence of thoracoabdominal aortic dissection. 3. No evidence of pulmonary embolus. 4. Aortic Atherosclerosis (ICD10-I70.0). Coronary artery atherosclerosis. Nonvascular: 1. Nonobstructing 5 mm left renal calculi. 2. Colonic diverticulosis without diverticulitis. 3. Fluid-filled left iliopsoas bursa compatible with bursitis. Electronically Signed   By: Ozell Daring M.D.   On: 03/22/2024 19:48   DG Chest Port 1 View Result Date: 03/22/2024 EXAM: 1 VIEW XRAY OF THE CHEST 03/22/2024 11:47:00 AM COMPARISON: 03/18/2008 Normal. CLINICAL HISTORY: Pt reports with center chest pain and shob after working out today. Pt states that he can't seem to catch his breath. FINDINGS: LUNGS AND PLEURA: No focal pulmonary opacity. No pulmonary edema. No pleural effusion. No pneumothorax. HEART AND MEDIASTINUM: No acute abnormality of the cardiac and mediastinal silhouettes. BONES AND SOFT TISSUES: No acute osseous abnormality. IMPRESSION: 1. No acute process. Electronically signed by: Waddell Calk MD 03/22/2024 12:03 PM EDT RP Workstation: [id number]        Scheduled Meds:  [START ON 03/24/2024] aspirin   81 mg Oral Pre-Cath   aspirin  EC  81 mg Oral Daily   cholecalciferol  1,000 Units Oral Daily   free water    500 mL Oral Once   latanoprost   1 drop Both Eyes QHS   multivitamin with minerals  1 tablet Oral Daily   potassium chloride   40 mEq Oral Once   rosuvastatin   5 mg Oral QHS   sodium chloride  flush  3 mL Intravenous Q12H   timolol   1 drop Both Eyes BID   Continuous Infusions:  sodium chloride      heparin  1,000 Units/hr (03/23/24 1532)     LOS: 0 days    Time spent: 40 minutes    Toribio Hummer, MD [facility]   To contact the attending provider between 7A-7P or the covering provider during after hours 7P-7A, please log into the web site [email or web address] and access using universal [place] password for that web site. If you do not have the password, please call the hospital operator.  03/23/2024, 3:47 PM

## 2024-03-23 NOTE — Plan of Care (Signed)

## 2024-03-23 NOTE — Consult Note (Signed)
 Cardiology Consultation  Patient ID: CAIGE ALMEDA MRN: [id number]; DOB: 1951-04-11  Admit date: 03/22/2024 Date of Consult: 03/23/2024  PCP:  [name] LABOR, MD   [facility] Providers Cardiologist:  None     Patient Profile: Andrew Coffey is a 73 y.o. male with a hx of BPH, GERD, no known cardiac history who is being seen 03/23/2024 for the evaluation of chest pain at the request of Dr. Madelyne.  History of Present Illness: Mr. Sprigg has past medical history listed above.  He presented to the [facility] emergency department on 03/22/2024 complaining of chest pain.  He reports he was working out with a Systems analyst when he began having chest pain, shortness of breath around 10:30 in the morning.  He reported the pain was 5 out of 10 and lasted about 1-1/2 hours reported as central/heavy/pressure-like.  He reported that he had no active chest pain when he was being evaluated in the emergency department.  He has no known past cardiac history.  He was previously on medication for hypertension however he has been off these medications for sometimes.  He denies any tobacco use, reports some social alcohol  use.  Relevant workup while in the ED/since admission includes: Troponin T 30 ? 61 ? 102 ? 94, lipid panel showed LDL at 53, Bement showed creatinine 1.46 ? 1.14, CBC normal, CXR showed no acute process, CTA dissection study showed 4.2 cm ascending thoracic aortic aneurysm, no dissection, no PE, coronary artery atherosclerosis, 5 mm nonobstructing left renal stone, diverticulosis without diverticulitis, possible bursitis of the iliopsoas bursa.  Pending updated echocardiogram today  Patient previously had a calcium  score done July 2025 that showed, calcium  score 520, 70th percentile, ascending aortic aneurysm measuring 45 mm.  He was admitted to the medicine service, cardiology was asked to consult in the setting of chest pain.  He he does not regularly see a  cardiologist.  He was previously on medications for hypertension however those have been discontinued.  After speak with the patient, he agrees with history stated above.  He tells me he has not had prior instances of chest pain, shortness of breath before this episode.  He reports that he has family history of heart disease in his father, having a heart attack in his 11s.  While he is never more currently smoke he does drink alcohol  socially.  Otherwise he seems to be very healthy, working out with a Systems analyst, remaining very active, on minimal medications for chronic diseases.  I discussed the various options in regards to ischemic evaluation for this patient.  Given his family history, calcium  score, troponin elevation, exertional chest pain symptoms reviewed to the decision that the patient would like to proceed with a cardiac catheterization, I will discuss with MD.   Past Medical History:  Diagnosis Date   Borderline glaucoma    BPH (benign prostatic hypertrophy)    Cancer (HCC)    basal cell carcinoma   GERD (gastroesophageal reflux disease)    History of acute renal failure    adx 10-05-2014  due to bilateral hydronephrosis   History of basal cell carcinoma excision    X2 FACE   History of colon polyps    2001   History of esophageal dilatation    severel times for tortuous esophagus   History of kidney stones    history of urinary retenion    Left nephrolithiasis    Migraine    probably 3 times/yr (10/04/2014)   Renal  cyst, left    Wears glasses    Past Surgical History:  Procedure Laterality Date   CATARACT EXTRACTION W/ INTRAOCULAR LENS IMPLANT Right 07/14/2010   COLONOSCOPY W/ POLYPECTOMY  07/15/1999   CYSTOSCOPY/URETEROSCOPY/HOLMIUM LASER/STENT PLACEMENT Left 03/20/2022   Procedure: CYSTOSCOPY/LEFT URETEROSCOPY/HOLMIUM LASER/STONE EXTRACTION/ LEFT URETERAL STENT PLACEMENT;  Surgeon: Cam Morene ORN, MD;  Location: [facility];  Service: Urology;   Laterality: Left;  100 MINUTES NEEDED   ESOPHAGOGASTRODUODENOSCOPY  10/22/2011   Procedure: ESOPHAGOGASTRODUODENOSCOPY (EGD);  Surgeon: Elsie Cree, MD;  Location: [facility];  Service: Endoscopy;  Laterality: N/A;   ESOPHAGOGASTRODUODENOSCOPY  11/21/2011   Procedure: ESOPHAGOGASTRODUODENOSCOPY (EGD);  Surgeon: Oliva FORBES Boots, MD;  Location: [facility] END[facility];  Service: Endoscopy;  Laterality: N/A;  have balloons available.    EXTRACORPOREAL SHOCK WAVE LITHOTRIPSY  07/14/2000   INGUINAL HERNIA REPAIR Right 01/24/2021   Procedure: LAPAROSCOPIC RIGHT INGUINAL AND UMBILICAL HERNIA REPAIR WITH MESH;  Surgeon: Rubin Calamity, MD;  Location: [facility];  Service: General;  Laterality: Right;   LAPAROSCOPIC PARTIAL NEPHRECTOMY Left 01/24/2021   Procedure: LAPAROSCOPIC LEFT RENAL CYST MARSUPIALIZATION;  Surgeon: Cam Morene ORN, MD;  Location: [facility];  Service: Urology;  Laterality: Left;   MOHS SURGERY  07/14/2012   nose   PARS PLANA VITRECTOMY W/ REPAIR OF MACULAR HOLE Right 08/14/2009   SAVORY DILATION  11/21/2011   Procedure: SAVORY DILATION;  Surgeon: Oliva FORBES Boots, MD;  Location: [facility] ENDOSCOPY;  Service: Endoscopy;  Laterality: N/A;   TONSILLECTOMY  ~ 1960   TRANSURETHRAL RESECTION OF PROSTATE N/A 11/01/2014   Procedure: TRANSURETHRAL RESECTION OF THE PROSTATE WITH GYRUS INSTRUMENTS;  Surgeon: Alm Fragmin, MD;  Location: [facility];  Service: Urology;  Laterality: N/A;   VENTRAL HERNIA REPAIR N/A 01/24/2021   Procedure: LAPAROSCOPIC VENTRAL HERNIA;  Surgeon: Rubin Calamity, MD;  Location: [facility];  Service: General;  Laterality: N/A;    Home Medications:  Prior to Admission medications   Medication Sig Start Date End Date Taking? Authorizing Provider  aspirin  EC 81 MG tablet Take 81 mg by mouth at bedtime. Swallow whole.   Yes [provider]  Cholecalciferol (VITAMIN D3 PO) Take 1 capsule by mouth daily.   Yes [provider]  Coenzyme Q10 (COQ-10) 200 MG  CAPS Take 200 mg by mouth daily.   Yes [provider]  HYDROcodone-acetaminophen  (NORCO/VICODIN) 5-325 MG tablet Take 1 tablet by mouth every 6 (six) hours as needed (FOR HEADACHES).   Yes [provider]  hydrocortisone  (ANUSOL -HC) 25 MG suppository Place 25 mg rectally daily as needed for hemorrhoids.   Yes [provider]  ketorolac  (TORADOL ) 10 MG tablet Take 10 mg by mouth every 6 (six) hours as needed (for headaches).   Yes [provider]  latanoprost  (XALATAN ) 0.005 % ophthalmic solution Place 1 drop into both eyes at bedtime.   Yes [provider]  rosuvastatin  (CRESTOR ) 5 MG tablet Take 5 mg by mouth at bedtime.   Yes [provider]  sildenafil (VIAGRA) 100 MG tablet Take 50 mg by mouth daily as needed (as directed).   Yes [provider]  timolol  (TIMOPTIC ) 0.5 % ophthalmic solution Place 1 drop into both eyes in the morning and at bedtime. 03/30/23  Yes [provider]  fluorouracil  (EFUDEX ) 5 % cream Apply topically 2 (two) times daily. Patient not taking: Reported on 03/22/2024 05/21/23   Magdalen Pasco RAMAN, DPM  terbinafine  (LAMISIL ) 250 MG tablet Take 1 tablet (250 mg total) by mouth daily. Patient  not taking: Reported on 03/22/2024 03/12/23   Magdalen Pasco RAMAN, DPM   Scheduled Meds:  aspirin  EC  81 mg Oral Daily   cholecalciferol  1,000 Units Oral Daily   enoxaparin  (LOVENOX ) injection  40 mg Subcutaneous Q24H   latanoprost   1 drop Both Eyes QHS   multivitamin with minerals  1 tablet Oral Daily   potassium chloride   40 mEq Oral Once   rosuvastatin   5 mg Oral QHS   sodium chloride  flush  3 mL Intravenous Q12H   timolol   1 drop Both Eyes BID   Continuous Infusions:  sodium chloride      lactated ringers  Stopped (03/22/24 2015)   PRN Meds: sodium chloride , acetaminophen  **OR** acetaminophen , hydrocortisone , ondansetron  **OR** ondansetron  (ZOFRAN ) IV, sodium chloride  flush  Allergies:    Allergies  Allergen  Reactions   Pseudoephedrine-Ibuprofen Other (See Comments)    urinary retention/incomplete emptying   Social History:   Social History   Socioeconomic History   Marital status: Married    Spouse name: Not on file   Number of children: Not on file   Years of education: Not on file   Highest education level: Not on file  Occupational History   Not on file  Tobacco Use   Smoking status: Never   Smokeless tobacco: Never  Vaping Use   Vaping status: Never Used  Substance and Sexual Activity   Alcohol  use: Yes    Alcohol /week: 4.0 standard drinks of alcohol     Types: 4 Glasses of wine per week    Comment: occ   Drug use: No   Sexual activity: Not on file  Other Topics Concern   Not on file  Social History Narrative   Not on file   Social Drivers of Health   Financial Resource Strain: Not on file  Food Insecurity: No Food Insecurity (03/22/2024)   Hunger Vital Sign    Worried About Running Out of Food in the Last Year: Never true    Ran Out of Food in the Last Year: Never true  Transportation Needs: No Transportation Needs (03/22/2024)   PRAPARE - Administrator, Civil Service (Medical): No    Lack of Transportation (Non-Medical): No  Physical Activity: Not on file  Stress: Not on file  Social Connections: Socially Integrated (03/22/2024)   Social Connection and Isolation Panel    Frequency of Communication with Friends and Family: More than three times a week    Frequency of Social Gatherings with Friends and Family: More than three times a week    Attends Religious Services: More than 4 times per year    Active Member of [employer] or Organizations: Yes    Attends Engineer, structural: More than 4 times per year    Marital Status: Married  Catering manager Violence: Not At Risk (03/22/2024)   Humiliation, Afraid, Rape, and Kick questionnaire    Fear of Current or Ex-Partner: No    Emotionally Abused: No    Physically Abused: No    Sexually Abused: No     Family History:   Family History  Problem Relation Age of Onset   Thrombocytopenia Mother        died of ITP   Heart attack Father    Thyroid  disease Sister     ROS:  Please see the history of present illness.  All other ROS reviewed and negative.     Physical Exam/Data: Vitals:   03/22/24 1800 03/22/24 2121 03/23/24 0137 03/23/24 0523  BP:  121/88 122/73 119/75 125/84  Pulse: (!) 58 (!) 55 (!) 59 (!) 53  Resp: 15 15 14 16   Temp: (!) 97.4 F (36.3 C) 98.2 F (36.8 C) 99.1 F (37.3 C) 98.8 F (37.1 C)  TempSrc: Oral Oral Oral Oral  SpO2: 96% 98% 99% 96%  Weight:      Height:       Intake/Output Summary (Last 24 hours) at 03/23/2024 1001 Last data filed at 03/22/2024 2330 Gross per 24 hour  Intake 927.9 ml  Output --  Net 927.9 ml      03/22/2024   11:35 AM 10/22/2023    9:32 AM 05/21/2023    3:43 PM  Last 3 Weights  Weight (lbs) 164 lb 14.5 oz 165 lb 165 lb 3.2 oz  Weight (kg) 74.8 kg 74.844 kg 74.934 kg     Body mass index is 23.66 kg/m.   General:  Well nourished, well developed, in no acute distress HEENT: normal Neck: no JVD Vascular: No carotid bruits; Distal pulses 2+ bilaterally Cardiac:  normal S1, S2; RRR; no murmur  Lungs:  clear to auscultation bilaterally, no wheezing, rhonchi or rales  Abd: soft, nontender, no hepatomegaly  Ext: no edema Musculoskeletal:  No deformities, BUE and BLE strength normal and equal Skin: warm and dry  Neuro:   no focal abnormalities noted Psych:  Normal affect   Telemetry:  Telemetry was personally reviewed and demonstrates:  sinus rhythm, HR 60s  Relevant CV Studies:  Echocardiogram, 03/23/2024 Ordered, pending result  Calcium  score, 01/20/2024 Coronary calcium  score of 520. This was 70th percentile for age-, race-, and sex-matched controls Ascending aortic aneurysm measuring 45mm. Recommend CTA or MRA chest for further evaluation Dilated main pulmonary artery measuring 32mm  Laboratory Data: High Sensitivity  Troponin:  No results for input(s): TROPONINIHS in the last 720 hours.   Chemistry Recent Labs  Lab 03/22/24 1134 03/23/24 0408  NA 142 142  K 4.2 3.5  CL 103 108  CO2 21* 21*  GLUCOSE 112* 93  BUN 17 19  CREATININE 1.46* 1.14  CALCIUM  10.0 9.1  MG  --  2.0  GFRNONAA 50* >60  ANIONGAP 18* 14    Recent Labs  Lab 03/23/24 0408  PROT 7.1  ALBUMIN  3.5  AST 23  ALT 19  ALKPHOS 40  BILITOT 0.5   Lipids  Recent Labs  Lab 03/22/24 1134  CHOL 114  TRIG 59  HDL 49  LDLCALC 53  CHOLHDL 2.3    Hematology Recent Labs  Lab 03/22/24 1134 03/23/24 0408  WBC 6.5 6.3  RBC 4.57 4.03*  HGB 13.5 11.9*  HCT 42.6 37.6*  MCV 93.2 93.3  MCH 29.5 29.5  MCHC 31.7 31.6  RDW 13.7 13.6  PLT 164 150   Thyroid  No results for input(s): TSH, FREET4 in the last 168 hours.  BNPNo results for input(s): BNP, PROBNP in the last 168 hours.  DDimer No results for input(s): DDIMER in the last 168 hours.  Radiology/Studies:  CT Angio Chest/Abd/Pel for Dissection W and/or W/WO Result Date: 03/22/2024 CLINICAL DATA:  Central chest pain and shortness of breath after working out today EXAM: CT ANGIOGRAPHY CHEST, ABDOMEN AND PELVIS TECHNIQUE: Non-contrast CT of the chest was initially obtained. Multidetector CT imaging through the chest, abdomen and pelvis was performed using the standard protocol during bolus administration of intravenous contrast. Multiplanar reconstructed images and MIPs were obtained and reviewed to evaluate the vascular anatomy. RADIATION DOSE REDUCTION: This exam was performed according to the departmental  dose-optimization program which includes automated exposure control, adjustment of the mA and/or kV according to patient size and/or use of iterative reconstruction technique. CONTRAST:  OMNIPAQUE  IOHEXOL  350 MG/ML SOLN COMPARISON:  02/11/2024 FINDINGS: CTA CHEST FINDINGS Cardiovascular: 4.2 cm ascending thoracic aortic aneurysm. No evidence of aortic dissection.  There is technically adequate opacification of the pulmonary vasculature. No filling defects or pulmonary emboli. The heart is unremarkable without pericardial effusion. Coronary artery atherosclerosis most pronounced in the LAD distribution. Mediastinum/Nodes: No enlarged mediastinal, hilar, or axillary lymph nodes. Thyroid  gland, trachea, and esophagus demonstrate no significant findings. Lungs/Pleura: Bibasilar hypoventilatory changes. No acute airspace disease, effusion, or pneumothorax. Central airways are patent. Musculoskeletal: No acute or destructive bony abnormalities. Reconstructed images demonstrate no additional findings. Review of the MIP images confirms the above findings. CTA ABDOMEN AND PELVIS FINDINGS VASCULAR Aorta: Normal caliber aorta without aneurysm, dissection, vasculitis or significant stenosis. Mild atherosclerosis. Celiac: Patent without evidence of aneurysm, dissection, vasculitis or significant stenosis. SMA: Patent without evidence of aneurysm, dissection, vasculitis or significant stenosis. Renals: There are 2 right renal arteries and a single left renal artery. Bilateral renal arteries are patent without evidence of aneurysm, dissection, vasculitis, fibromuscular dysplasia, or significant stenosis. IMA: Patent without evidence of aneurysm, dissection, vasculitis or significant stenosis. Inflow: Patent without evidence of aneurysm, dissection, vasculitis or significant stenosis. Veins: No obvious venous abnormality within the limitations of this arterial phase study. Review of the MIP images confirms the above findings. NON-VASCULAR Hepatobiliary: No focal liver abnormality is seen. No gallstones, gallbladder wall thickening, or biliary dilatation. Pancreas: Unremarkable. No pancreatic ductal dilatation or surrounding inflammatory changes. Spleen: Normal in size without focal abnormality. Adrenals/Urinary Tract: There are bilateral simple appearing renal cortical and peripelvic cysts.  There are 2 nonobstructing 5 mm left renal calculi. No right-sided calculi. No obstructive uropathy within either kidney. The adrenals are unremarkable. Stomach/Bowel: No bowel obstruction or ileus. Normal appendix right lower quadrant. Diffuse colonic diverticulosis without diverticulitis. No bowel wall thickening or inflammatory change. Lymphatic: No pathologic adenopathy. Reproductive: Prostate is enlarged, with TURP defect identified. Other: No free fluid or free intraperitoneal gas. No abdominal wall hernia. Musculoskeletal: No acute or destructive bony abnormalities. Distended fluid-filled left iliopsoas bursa. Reconstructed images demonstrate no additional findings. Review of the MIP images confirms the above findings. IMPRESSION: Vascular: 1. 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 Recommend annual imaging followup by CTA or MRA. This recommendation follows 2010 ACCF/AHA/AATS/ACR/ASA/SCA/SCAI/SIR/STS/SVM Guidelines for the Diagnosis and Management of Patients with Thoracic Aortic Disease. Circulation. 2010; 121: [id number]. Aortic aneurysm NOS (ICD10-I71.9) 2. No evidence of thoracoabdominal aortic dissection. 3. No evidence of pulmonary embolus. 4. Aortic Atherosclerosis (ICD10-I70.0). Coronary artery atherosclerosis. Nonvascular: 1. Nonobstructing 5 mm left renal calculi. 2. Colonic diverticulosis without diverticulitis. 3. Fluid-filled left iliopsoas bursa compatible with bursitis. Electronically Signed   By: Ozell Daring M.D.   On: 03/22/2024 19:48   DG Chest Port 1 View Result Date: 03/22/2024 EXAM: 1 VIEW XRAY OF THE CHEST 03/22/2024 11:47:00 AM COMPARISON: 03/18/2008 Normal. CLINICAL HISTORY: Pt reports with center chest pain and shob after working out today. Pt states that he can't seem to catch his breath. FINDINGS: LUNGS AND PLEURA: No focal pulmonary opacity. No pulmonary edema. No pleural effusion. No pneumothorax. HEART AND MEDIASTINUM: No acute abnormality of the cardiac and mediastinal  silhouettes. BONES AND SOFT TISSUES: No acute osseous abnormality. IMPRESSION: 1. No acute process. Electronically signed by: Waddell Calk MD 03/22/2024 12:03 PM EDT RP Workstation: [id number]   Assessment and Plan:  NSTEMI Presented  to the ED after having chest pain while working out Family history of heart disease in his father, heart attack in his 24s Resolved with rest Troponin T 30 ? 61 ? 102 ? 94 Calcium  score from July 2025: 520, 70th percentile Pending updated echocardiogram LDL 53 A1c 5.4% Given 324 mg ASA He currently has no active chest pain or shortness of breath Started on ASA 81 mg daily Discussed various options of ischemic evaluation with the patient, he was wanting to proceed with a cardiac catheterization at this time, I will discuss with MD  Informed Consent   Shared Decision Making/Informed Consent The risks [stroke (1 in 1000), death (1 in 1000), kidney failure [usually temporary] (1 in 500), bleeding (1 in 200), allergic reaction [possibly serious] (1 in 200)], benefits (diagnostic support and management of coronary artery disease) and alternatives of a cardiac catheterization were discussed in detail with Mr. Neilan and he is willing to proceed.    Hyperlipidemia Home meds: Crestor  5 mg daily LDL at goal  Continue statin  Per primary Glaucoma  Dehydration   Electrolyte disturbances  Risk Assessment/Risk Scores:    TIMI Risk Score for Unstable Angina or Non-ST Elevation MI:   The patient's TIMI risk score is 4, which indicates a 20% risk of all cause mortality, new or recurrent myocardial infarction or need for urgent revascularization in the next 14 days.    For questions or updates, please contact [facility] Please consult [email or web address] for contact info under    Signed, Waddell DELENA Donath, PA-C  03/23/2024 10:01 AM

## 2024-03-23 NOTE — Discharge Summary (Signed)
 Physician Discharge Summary      Patient ID: Andrew Coffey MRN: [id number] DOB/AGE: 73-Jan-1952 73 y.o. Andrew Coffey LABOR, MD   Admit date: 03/22/2024 Discharge date: 03/23/2024 0   Primary Discharge Diagnosis 1.  Unstable angina/NSTEMI probably related to either small vessel disease or coronary spasm.  No significant CAD by cardiac catheterization on 1925. 2.  Hypercholesterolemia 3.  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   Significant Diagnostic Studies:  ECHOCARDIOGRAM COMPLETE 03/23/2024  1. Left ventricular ejection fraction, by estimation, is 60 to 65%. The left ventricle has normal function. The left ventricle has no regional wall motion abnormalities. There is mild left ventricular hypertrophy. Left ventricular diastolic parameters are consistent with Grade I diastolic dysfunction (impaired relaxation). 2. Right ventricular systolic function is normal. The right ventricular size is normal. There is normal pulmonary artery systolic pressure. The estimated right ventricular systolic pressure is 17.3 mmHg. 3. Right atrial size was mildly dilated. 4. The mitral valve is grossly normal. Trivial mitral valve regurgitation. 5. The aortic valve is tricuspid. Aortic valve regurgitation is not visualized. Aortic valve sclerosis is present, with no evidence of aortic valve stenosis. 6. Aortic dilatation noted. There is mild dilatation of the ascending aorta, measuring 44 mm. 7. The inferior vena cava is normal in size with greater than 50% respiratory variability, suggesting right atrial pressure of 3 mmHg.  Cardiac Catheterization 03/23/24: Hemodynamic data: LVEDP 12 mmHg.  There is no pressure gradient across the aortic valve.   Angiographic data: LM: It is calcified periarterial, no luminal obstruction. LAD: Mild proximal and mid LAD periarterial calcification noted.  Minimal luminal irregularity in the proximal LAD.   Small D1, large D2. RI: Large-caliber vessel.  Smooth and normal. LCx: Mild periarterial calcification evident.  Gives origin to moderate-sized OM 3. RCA: Very large caliber vessel with minimal luminal irregularity and mild periarterial calcification.  Moderate-sized PDA and large PL branch.      Impression and recommendations: Patient presented with chest pain and minimally elevated serum troponin, 30>> 61>> 102>> 94 suggesting ACS/NSTEMI.  However coronary angiography revealing widely patent coronary vessels without any ulcerated plaque or unstable lesions.  Could be related to small vessel disease or may be transient coronary spasm leading to NSTEMI.  For now continue risk modification, continue aspirin  for 3 to 6 months, recently started on statin, rosuvastatin  increased dose to 10 mg daily with goal LDL <70. Mild aortic root dilatation, no evidence of aortic aneurysm.  Probably normal for his height and weight.  Radiology:  CT Angio Chest/Abd/Pel for Dissection W and/or W/WO 03/22/2024:  Vascular:  1. 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 Recommend annual imaging followup by CTA or MRA. This recommendation follows 2010 ACCF/AHA/AATS/ACR/ASA/SCA/SCAI/SIR/STS/SVM Guidelines for the Diagnosis and Management of Patients with Thoracic Aortic Disease. Circulation. 2010; 121: Z733-z630. Aortic aneurysm NOS (ICD10-I71.9)  2. No evidence of thoracoabdominal aortic dissection.  3. No evidence of pulmonary embolus.  4. Aortic Atherosclerosis (ICD10-I70.0). Coronary artery atherosclerosis.   Nonvascular:  1. Nonobstructing 5 mm left renal calculi.  2. Colonic diverticulosis without diverticulitis.  3. Fluid-filled left iliopsoas bursa compatible with bursitis.    Hospital Course: [name] is a 73 y.o. male  patient  history of BPH, GERD and torturous esophagus status post dilatation, kidney stones with history of bilateral hydronephrosis with acute renal failure in 2016 and coronary  calcifications by coronary calcium  score in July 2025 showing a coronary cal score of 520 which  is 73 percentile for age, race and sex matched controls.  He has family history of premature coronary disease in his father who had myocardial infarction at age of 73.  Patient was working out with his trainer when he started having chest discomfort associated with shortness of breath and presented to the emergency room on 03/22/2024 where his EKG revealed no significant ischemic changes, serial troponins were mildly abnormal suggesting NSTEMI and he was admitted to the hospital for further evaluation and management.  He was also ruled out for PE and aortic dissection by CTA of the chest and abdomen.  The following day he underwent cardiac catheterization revealing no significant coronary disease.  I felt he was stable enough to be discharged home with outpatient management.  Recommendations on discharge: Aspirin  for 3 to 6 months as he is really does not have any significant coronary disease of >50% stenosis.  Suspect he probably has small vessel disease or probably had mild coronary spasm during strenuous heavy lifting.  Aggressive risk modification in view of coronary atherosclerosis and aortic atherosclerosis is indicated, LDL is at goal however will increase his rosuvastatin  to 10 mg daily.  He will be discharged with outpatient follow-up.  With regard to mild ascending aortic root dilatation and aortic dilatation, do not think he needs any other management in the absence of hypertension or significant risk factors of diabetes or family history of aortic aneurysm, could consider repeating an echocardiogram or a CT in 3 to 5 years.  Discharge Exam:    03/23/2024    5:36 PM 03/23/2024    5:31 PM 03/23/2024    5:26 PM  Vitals with BMI  Systolic 123 130 872  Diastolic 71 78 77  Pulse 60 51 66     Physical Exam Neck:     Vascular: No carotid bruit or JVD.  Cardiovascular:     Rate and Rhythm: Normal  rate and regular rhythm.     Pulses: Intact distal pulses.     Heart sounds: Normal heart sounds. No murmur heard.    No gallop.  Pulmonary:     Effort: Pulmonary effort is normal.     Breath sounds: Normal breath sounds.  Abdominal:     General: Bowel sounds are normal.     Palpations: Abdomen is soft.  Musculoskeletal:     Right lower leg: No edema.     Left lower leg: No edema.     Labs:   Lab Results  Component Value Date   WBC 6.3 03/23/2024   HGB 11.9 (L) 03/23/2024   HCT 37.6 (L) 03/23/2024   MCV 93.3 03/23/2024   PLT 150 03/23/2024    Recent Labs  Lab 03/23/24 0408  NA 142  K 3.5  CL 108  CO2 21*  BUN 19  CREATININE 1.14  CALCIUM  9.1  PROT 7.1  BILITOT 0.5  ALKPHOS 40  ALT 19  AST 23  GLUCOSE 93    Lipid Panel     Component Value Date/Time   CHOL 114 03/22/2024 1134   TRIG 59 03/22/2024 1134   HDL 49 03/22/2024 1134   CHOLHDL 2.3 03/22/2024 1134   VLDL 12 03/22/2024 1134   LDLCALC 53 03/22/2024 1134   FOLLOW UP PLANS AND APPOINTMENTS  Allergies as of 03/23/2024       Reactions   Pseudoephedrine-ibuprofen Other (See Comments)   urinary retention/incomplete emptying        Medication List     STOP taking these medications  terbinafine  250 MG tablet Commonly known as: LamISIL        TAKE these medications    aspirin  EC 81 MG tablet Take 81 mg by mouth at bedtime. Swallow whole.   CoQ-10 200 MG Caps Take 200 mg by mouth daily.   fluorouracil  5 % cream Commonly known as: Efudex  Apply topically 2 (two) times daily.   HYDROcodone-acetaminophen  5-325 MG tablet Commonly known as: NORCO/VICODIN Take 1 tablet by mouth every 6 (six) hours as needed (FOR HEADACHES).   hydrocortisone  25 MG suppository Commonly known as: ANUSOL -HC Place 25 mg rectally daily as needed for hemorrhoids.   ketorolac  10 MG tablet Commonly known as: TORADOL  Take 10 mg by mouth every 6 (six) hours as needed (for headaches).   latanoprost  0.005 %  ophthalmic solution Commonly known as: XALATAN  Place 1 drop into both eyes at bedtime.   rosuvastatin  10 MG tablet Commonly known as: CRESTOR  Take 1 tablet (10 mg total) by mouth at bedtime. What changed:  medication strength how much to take   sildenafil 100 MG tablet Commonly known as: VIAGRA Take 50 mg by mouth daily as needed (as directed).   timolol  0.5 % ophthalmic solution Commonly known as: TIMOPTIC  Place 1 drop into both eyes in the morning and at bedtime.   VITAMIN D3 PO Take 1 capsule by mouth daily.       Gordy Bergamo, MD, [facility] 03/23/2024, 6:14 PM [facility] [address] [place], [place] [address] Phone: [phone]. Fax:  [phone]

## 2024-03-23 NOTE — Plan of Care (Signed)
  Problem: Clinical Measurements: Goal: Respiratory complications will improve Outcome: Progressing   Problem: Elimination: Goal: Will not experience complications related to bowel motility Outcome: Progressing   Problem: Elimination: Goal: Will not experience complications related to urinary retention Outcome: Progressing   Problem: Safety: Goal: Ability to remain free from injury will improve Outcome: Progressing

## 2024-03-23 NOTE — Progress Notes (Signed)
  Echocardiogram 2D Echocardiogram has been performed.  Tinnie FORBES Gosling RDCS 03/23/2024, 2:22 PM

## 2024-03-23 NOTE — H&P (View-Only) (Signed)
 Cardiology Consultation  Patient ID: Andrew Coffey MRN: [id number]; DOB: 1951-04-11  Admit date: 03/22/2024 Date of Consult: 03/23/2024  PCP:  [name] LABOR, MD   [facility] Providers Cardiologist:  None     Patient Profile: Andrew Coffey is a 73 y.o. male with a hx of BPH, GERD, no known cardiac history who is being seen 03/23/2024 for the evaluation of chest pain at the request of Dr. Madelyne.  History of Present Illness: Andrew Coffey has past medical history listed above.  He presented to the [facility] emergency department on 03/22/2024 complaining of chest pain.  He reports he was working out with a Systems analyst when he began having chest pain, shortness of breath around 10:30 in the morning.  He reported the pain was 5 out of 10 and lasted about 1-1/2 hours reported as central/heavy/pressure-like.  He reported that he had no active chest pain when he was being evaluated in the emergency department.  He has no known past cardiac history.  He was previously on medication for hypertension however he has been off these medications for sometimes.  He denies any tobacco use, reports some social alcohol  use.  Relevant workup while in the ED/since admission includes: Troponin T 30 ? 61 ? 102 ? 94, lipid panel showed LDL at 53, Bement showed creatinine 1.46 ? 1.14, CBC normal, CXR showed no acute process, CTA dissection study showed 4.2 cm ascending thoracic aortic aneurysm, no dissection, no PE, coronary artery atherosclerosis, 5 mm nonobstructing left renal stone, diverticulosis without diverticulitis, possible bursitis of the iliopsoas bursa.  Pending updated echocardiogram today  Patient previously had a calcium  score done July 2025 that showed, calcium  score 520, 70th percentile, ascending aortic aneurysm measuring 45 mm.  He was admitted to the medicine service, cardiology was asked to consult in the setting of chest pain.  He he does not regularly see a  cardiologist.  He was previously on medications for hypertension however those have been discontinued.  After speak with the patient, he agrees with history stated above.  He tells me he has not had prior instances of chest pain, shortness of breath before this episode.  He reports that he has family history of heart disease in his father, having a heart attack in his 11s.  While he is never more currently smoke he does drink alcohol  socially.  Otherwise he seems to be very healthy, working out with a Systems analyst, remaining very active, on minimal medications for chronic diseases.  I discussed the various options in regards to ischemic evaluation for this patient.  Given his family history, calcium  score, troponin elevation, exertional chest pain symptoms reviewed to the decision that the patient would like to proceed with a cardiac catheterization, I will discuss with MD.   Past Medical History:  Diagnosis Date   Borderline glaucoma    BPH (benign prostatic hypertrophy)    Cancer (HCC)    basal cell carcinoma   GERD (gastroesophageal reflux disease)    History of acute renal failure    adx 10-05-2014  due to bilateral hydronephrosis   History of basal cell carcinoma excision    X2 FACE   History of colon polyps    2001   History of esophageal dilatation    severel times for tortuous esophagus   History of kidney stones    history of urinary retenion    Left nephrolithiasis    Migraine    probably 3 times/yr (10/04/2014)   Renal  cyst, left    Wears glasses    Past Surgical History:  Procedure Laterality Date   CATARACT EXTRACTION W/ INTRAOCULAR LENS IMPLANT Right 07/14/2010   COLONOSCOPY W/ POLYPECTOMY  07/15/1999   CYSTOSCOPY/URETEROSCOPY/HOLMIUM LASER/STENT PLACEMENT Left 03/20/2022   Procedure: CYSTOSCOPY/LEFT URETEROSCOPY/HOLMIUM LASER/STONE EXTRACTION/ LEFT URETERAL STENT PLACEMENT;  Surgeon: Cam Morene ORN, MD;  Location: [facility];  Service: Urology;   Laterality: Left;  100 MINUTES NEEDED   ESOPHAGOGASTRODUODENOSCOPY  10/22/2011   Procedure: ESOPHAGOGASTRODUODENOSCOPY (EGD);  Surgeon: Elsie Cree, MD;  Location: [facility];  Service: Endoscopy;  Laterality: N/A;   ESOPHAGOGASTRODUODENOSCOPY  11/21/2011   Procedure: ESOPHAGOGASTRODUODENOSCOPY (EGD);  Surgeon: Oliva FORBES Boots, MD;  Location: [facility] END[facility];  Service: Endoscopy;  Laterality: N/A;  have balloons available.    EXTRACORPOREAL SHOCK WAVE LITHOTRIPSY  07/14/2000   INGUINAL HERNIA REPAIR Right 01/24/2021   Procedure: LAPAROSCOPIC RIGHT INGUINAL AND UMBILICAL HERNIA REPAIR WITH MESH;  Surgeon: Rubin Calamity, MD;  Location: [facility];  Service: General;  Laterality: Right;   LAPAROSCOPIC PARTIAL NEPHRECTOMY Left 01/24/2021   Procedure: LAPAROSCOPIC LEFT RENAL CYST MARSUPIALIZATION;  Surgeon: Cam Morene ORN, MD;  Location: [facility];  Service: Urology;  Laterality: Left;   MOHS SURGERY  07/14/2012   nose   PARS PLANA VITRECTOMY W/ REPAIR OF MACULAR HOLE Right 08/14/2009   SAVORY DILATION  11/21/2011   Procedure: SAVORY DILATION;  Surgeon: Oliva FORBES Boots, MD;  Location: [facility] ENDOSCOPY;  Service: Endoscopy;  Laterality: N/A;   TONSILLECTOMY  ~ 1960   TRANSURETHRAL RESECTION OF PROSTATE N/A 11/01/2014   Procedure: TRANSURETHRAL RESECTION OF THE PROSTATE WITH GYRUS INSTRUMENTS;  Surgeon: Alm Fragmin, MD;  Location: [facility];  Service: Urology;  Laterality: N/A;   VENTRAL HERNIA REPAIR N/A 01/24/2021   Procedure: LAPAROSCOPIC VENTRAL HERNIA;  Surgeon: Rubin Calamity, MD;  Location: [facility];  Service: General;  Laterality: N/A;    Home Medications:  Prior to Admission medications   Medication Sig Start Date End Date Taking? Authorizing Provider  aspirin  EC 81 MG tablet Take 81 mg by mouth at bedtime. Swallow whole.   Yes [provider]  Cholecalciferol (VITAMIN D3 PO) Take 1 capsule by mouth daily.   Yes [provider]  Coenzyme Q10 (COQ-10) 200 MG  CAPS Take 200 mg by mouth daily.   Yes [provider]  HYDROcodone-acetaminophen  (NORCO/VICODIN) 5-325 MG tablet Take 1 tablet by mouth every 6 (six) hours as needed (FOR HEADACHES).   Yes [provider]  hydrocortisone  (ANUSOL -HC) 25 MG suppository Place 25 mg rectally daily as needed for hemorrhoids.   Yes [provider]  ketorolac  (TORADOL ) 10 MG tablet Take 10 mg by mouth every 6 (six) hours as needed (for headaches).   Yes [provider]  latanoprost  (XALATAN ) 0.005 % ophthalmic solution Place 1 drop into both eyes at bedtime.   Yes [provider]  rosuvastatin  (CRESTOR ) 5 MG tablet Take 5 mg by mouth at bedtime.   Yes [provider]  sildenafil (VIAGRA) 100 MG tablet Take 50 mg by mouth daily as needed (as directed).   Yes [provider]  timolol  (TIMOPTIC ) 0.5 % ophthalmic solution Place 1 drop into both eyes in the morning and at bedtime. 03/30/23  Yes [provider]  fluorouracil  (EFUDEX ) 5 % cream Apply topically 2 (two) times daily. Patient not taking: Reported on 03/22/2024 05/21/23   Magdalen Pasco RAMAN, DPM  terbinafine  (LAMISIL ) 250 MG tablet Take 1 tablet (250 mg total) by mouth daily. Patient  not taking: Reported on 03/22/2024 03/12/23   Magdalen Pasco RAMAN, DPM   Scheduled Meds:  aspirin  EC  81 mg Oral Daily   cholecalciferol  1,000 Units Oral Daily   enoxaparin  (LOVENOX ) injection  40 mg Subcutaneous Q24H   latanoprost   1 drop Both Eyes QHS   multivitamin with minerals  1 tablet Oral Daily   potassium chloride   40 mEq Oral Once   rosuvastatin   5 mg Oral QHS   sodium chloride  flush  3 mL Intravenous Q12H   timolol   1 drop Both Eyes BID   Continuous Infusions:  sodium chloride      lactated ringers  Stopped (03/22/24 2015)   PRN Meds: sodium chloride , acetaminophen  **OR** acetaminophen , hydrocortisone , ondansetron  **OR** ondansetron  (ZOFRAN ) IV, sodium chloride  flush  Allergies:    Allergies  Allergen  Reactions   Pseudoephedrine-Ibuprofen Other (See Comments)    urinary retention/incomplete emptying   Social History:   Social History   Socioeconomic History   Marital status: Married    Spouse name: Not on file   Number of children: Not on file   Years of education: Not on file   Highest education level: Not on file  Occupational History   Not on file  Tobacco Use   Smoking status: Never   Smokeless tobacco: Never  Vaping Use   Vaping status: Never Used  Substance and Sexual Activity   Alcohol  use: Yes    Alcohol /week: 4.0 standard drinks of alcohol     Types: 4 Glasses of wine per week    Comment: occ   Drug use: No   Sexual activity: Not on file  Other Topics Concern   Not on file  Social History Narrative   Not on file   Social Drivers of Health   Financial Resource Strain: Not on file  Food Insecurity: No Food Insecurity (03/22/2024)   Hunger Vital Sign    Worried About Running Out of Food in the Last Year: Never true    Ran Out of Food in the Last Year: Never true  Transportation Needs: No Transportation Needs (03/22/2024)   PRAPARE - Administrator, Civil Service (Medical): No    Lack of Transportation (Non-Medical): No  Physical Activity: Not on file  Stress: Not on file  Social Connections: Socially Integrated (03/22/2024)   Social Connection and Isolation Panel    Frequency of Communication with Friends and Family: More than three times a week    Frequency of Social Gatherings with Friends and Family: More than three times a week    Attends Religious Services: More than 4 times per year    Active Member of [employer] or Organizations: Yes    Attends Engineer, structural: More than 4 times per year    Marital Status: Married  Catering manager Violence: Not At Risk (03/22/2024)   Humiliation, Afraid, Rape, and Kick questionnaire    Fear of Current or Ex-Partner: No    Emotionally Abused: No    Physically Abused: No    Sexually Abused: No     Family History:   Family History  Problem Relation Age of Onset   Thrombocytopenia Mother        died of ITP   Heart attack Father    Thyroid  disease Sister     ROS:  Please see the history of present illness.  All other ROS reviewed and negative.     Physical Exam/Data: Vitals:   03/22/24 1800 03/22/24 2121 03/23/24 0137 03/23/24 0523  BP:  121/88 122/73 119/75 125/84  Pulse: (!) 58 (!) 55 (!) 59 (!) 53  Resp: 15 15 14 16   Temp: (!) 97.4 F (36.3 C) 98.2 F (36.8 C) 99.1 F (37.3 C) 98.8 F (37.1 C)  TempSrc: Oral Oral Oral Oral  SpO2: 96% 98% 99% 96%  Weight:      Height:       Intake/Output Summary (Last 24 hours) at 03/23/2024 1001 Last data filed at 03/22/2024 2330 Gross per 24 hour  Intake 927.9 ml  Output --  Net 927.9 ml      03/22/2024   11:35 AM 10/22/2023    9:32 AM 05/21/2023    3:43 PM  Last 3 Weights  Weight (lbs) 164 lb 14.5 oz 165 lb 165 lb 3.2 oz  Weight (kg) 74.8 kg 74.844 kg 74.934 kg     Body mass index is 23.66 kg/m.   General:  Well nourished, well developed, in no acute distress HEENT: normal Neck: no JVD Vascular: No carotid bruits; Distal pulses 2+ bilaterally Cardiac:  normal S1, S2; RRR; no murmur  Lungs:  clear to auscultation bilaterally, no wheezing, rhonchi or rales  Abd: soft, nontender, no hepatomegaly  Ext: no edema Musculoskeletal:  No deformities, BUE and BLE strength normal and equal Skin: warm and dry  Neuro:   no focal abnormalities noted Psych:  Normal affect   Telemetry:  Telemetry was personally reviewed and demonstrates:  sinus rhythm, HR 60s  Relevant CV Studies:  Echocardiogram, 03/23/2024 Ordered, pending result  Calcium  score, 01/20/2024 Coronary calcium  score of 520. This was 70th percentile for age-, race-, and sex-matched controls Ascending aortic aneurysm measuring 45mm. Recommend CTA or MRA chest for further evaluation Dilated main pulmonary artery measuring 32mm  Laboratory Data: High Sensitivity  Troponin:  No results for input(s): TROPONINIHS in the last 720 hours.   Chemistry Recent Labs  Lab 03/22/24 1134 03/23/24 0408  NA 142 142  K 4.2 3.5  CL 103 108  CO2 21* 21*  GLUCOSE 112* 93  BUN 17 19  CREATININE 1.46* 1.14  CALCIUM  10.0 9.1  MG  --  2.0  GFRNONAA 50* >60  ANIONGAP 18* 14    Recent Labs  Lab 03/23/24 0408  PROT 7.1  ALBUMIN  3.5  AST 23  ALT 19  ALKPHOS 40  BILITOT 0.5   Lipids  Recent Labs  Lab 03/22/24 1134  CHOL 114  TRIG 59  HDL 49  LDLCALC 53  CHOLHDL 2.3    Hematology Recent Labs  Lab 03/22/24 1134 03/23/24 0408  WBC 6.5 6.3  RBC 4.57 4.03*  HGB 13.5 11.9*  HCT 42.6 37.6*  MCV 93.2 93.3  MCH 29.5 29.5  MCHC 31.7 31.6  RDW 13.7 13.6  PLT 164 150   Thyroid  No results for input(s): TSH, FREET4 in the last 168 hours.  BNPNo results for input(s): BNP, PROBNP in the last 168 hours.  DDimer No results for input(s): DDIMER in the last 168 hours.  Radiology/Studies:  CT Angio Chest/Abd/Pel for Dissection W and/or W/WO Result Date: 03/22/2024 CLINICAL DATA:  Central chest pain and shortness of breath after working out today EXAM: CT ANGIOGRAPHY CHEST, ABDOMEN AND PELVIS TECHNIQUE: Non-contrast CT of the chest was initially obtained. Multidetector CT imaging through the chest, abdomen and pelvis was performed using the standard protocol during bolus administration of intravenous contrast. Multiplanar reconstructed images and MIPs were obtained and reviewed to evaluate the vascular anatomy. RADIATION DOSE REDUCTION: This exam was performed according to the departmental  dose-optimization program which includes automated exposure control, adjustment of the mA and/or kV according to patient size and/or use of iterative reconstruction technique. CONTRAST:  OMNIPAQUE  IOHEXOL  350 MG/ML SOLN COMPARISON:  02/11/2024 FINDINGS: CTA CHEST FINDINGS Cardiovascular: 4.2 cm ascending thoracic aortic aneurysm. No evidence of aortic dissection.  There is technically adequate opacification of the pulmonary vasculature. No filling defects or pulmonary emboli. The heart is unremarkable without pericardial effusion. Coronary artery atherosclerosis most pronounced in the LAD distribution. Mediastinum/Nodes: No enlarged mediastinal, hilar, or axillary lymph nodes. Thyroid  gland, trachea, and esophagus demonstrate no significant findings. Lungs/Pleura: Bibasilar hypoventilatory changes. No acute airspace disease, effusion, or pneumothorax. Central airways are patent. Musculoskeletal: No acute or destructive bony abnormalities. Reconstructed images demonstrate no additional findings. Review of the MIP images confirms the above findings. CTA ABDOMEN AND PELVIS FINDINGS VASCULAR Aorta: Normal caliber aorta without aneurysm, dissection, vasculitis or significant stenosis. Mild atherosclerosis. Celiac: Patent without evidence of aneurysm, dissection, vasculitis or significant stenosis. SMA: Patent without evidence of aneurysm, dissection, vasculitis or significant stenosis. Renals: There are 2 right renal arteries and a single left renal artery. Bilateral renal arteries are patent without evidence of aneurysm, dissection, vasculitis, fibromuscular dysplasia, or significant stenosis. IMA: Patent without evidence of aneurysm, dissection, vasculitis or significant stenosis. Inflow: Patent without evidence of aneurysm, dissection, vasculitis or significant stenosis. Veins: No obvious venous abnormality within the limitations of this arterial phase study. Review of the MIP images confirms the above findings. NON-VASCULAR Hepatobiliary: No focal liver abnormality is seen. No gallstones, gallbladder wall thickening, or biliary dilatation. Pancreas: Unremarkable. No pancreatic ductal dilatation or surrounding inflammatory changes. Spleen: Normal in size without focal abnormality. Adrenals/Urinary Tract: There are bilateral simple appearing renal cortical and peripelvic cysts.  There are 2 nonobstructing 5 mm left renal calculi. No right-sided calculi. No obstructive uropathy within either kidney. The adrenals are unremarkable. Stomach/Bowel: No bowel obstruction or ileus. Normal appendix right lower quadrant. Diffuse colonic diverticulosis without diverticulitis. No bowel wall thickening or inflammatory change. Lymphatic: No pathologic adenopathy. Reproductive: Prostate is enlarged, with TURP defect identified. Other: No free fluid or free intraperitoneal gas. No abdominal wall hernia. Musculoskeletal: No acute or destructive bony abnormalities. Distended fluid-filled left iliopsoas bursa. Reconstructed images demonstrate no additional findings. Review of the MIP images confirms the above findings. IMPRESSION: Vascular: 1. 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 Recommend annual imaging followup by CTA or MRA. This recommendation follows 2010 ACCF/AHA/AATS/ACR/ASA/SCA/SCAI/SIR/STS/SVM Guidelines for the Diagnosis and Management of Patients with Thoracic Aortic Disease. Circulation. 2010; 121: Z733-z630. Aortic aneurysm NOS (ICD10-I71.9) 2. No evidence of thoracoabdominal aortic dissection. 3. No evidence of pulmonary embolus. 4. Aortic Atherosclerosis (ICD10-I70.0). Coronary artery atherosclerosis. Nonvascular: 1. Nonobstructing 5 mm left renal calculi. 2. Colonic diverticulosis without diverticulitis. 3. Fluid-filled left iliopsoas bursa compatible with bursitis. Electronically Signed   By: Ozell Daring M.D.   On: 03/22/2024 19:48   DG Chest Port 1 View Result Date: 03/22/2024 EXAM: 1 VIEW XRAY OF THE CHEST 03/22/2024 11:47:00 AM COMPARISON: 03/18/2008 Normal. CLINICAL HISTORY: Pt reports with center chest pain and shob after working out today. Pt states that he can't seem to catch his breath. FINDINGS: LUNGS AND PLEURA: No focal pulmonary opacity. No pulmonary edema. No pleural effusion. No pneumothorax. HEART AND MEDIASTINUM: No acute abnormality of the cardiac and mediastinal  silhouettes. BONES AND SOFT TISSUES: No acute osseous abnormality. IMPRESSION: 1. No acute process. Electronically signed by: Waddell Calk MD 03/22/2024 12:03 PM EDT RP Workstation: [id number]   Assessment and Plan:  NSTEMI Presented  to the ED after having chest pain while working out Family history of heart disease in his father, heart attack in his 24s Resolved with rest Troponin T 30 ? 61 ? 102 ? 94 Calcium  score from July 2025: 520, 70th percentile Pending updated echocardiogram LDL 53 A1c 5.4% Given 324 mg ASA He currently has no active chest pain or shortness of breath Started on ASA 81 mg daily Discussed various options of ischemic evaluation with the patient, he was wanting to proceed with a cardiac catheterization at this time, I will discuss with MD  Informed Consent   Shared Decision Making/Informed Consent The risks [stroke (1 in 1000), death (1 in 1000), kidney failure [usually temporary] (1 in 500), bleeding (1 in 200), allergic reaction [possibly serious] (1 in 200)], benefits (diagnostic support and management of coronary artery disease) and alternatives of a cardiac catheterization were discussed in detail with Andrew Coffey and he is willing to proceed.    Hyperlipidemia Home meds: Crestor  5 mg daily LDL at goal  Continue statin  Per primary Glaucoma  Dehydration   Electrolyte disturbances  Risk Assessment/Risk Scores:    TIMI Risk Score for Unstable Angina or Non-ST Elevation MI:   The patient's TIMI risk score is 4, which indicates a 20% risk of all cause mortality, new or recurrent myocardial infarction or need for urgent revascularization in the next 14 days.    For questions or updates, please contact [facility] Please consult [email or web address] for contact info under    Signed, Waddell DELENA Donath, PA-C  03/23/2024 10:01 AM

## 2024-03-23 NOTE — Progress Notes (Signed)
 Patient transported via stretcher by [facility] to main campus to procedural area for a heart cath. No signs of acute distress noted or voiced. All personal belongings forwarded with spouse. Patient retained glasses, a book, and a pair of socks. Telemetry monitoring placed on standby.

## 2024-03-23 NOTE — Progress Notes (Signed)
 PHARMACY - ANTICOAGULATION CONSULT NOTE  Pharmacy Consult for IV heparin  Indication: chest pain/ACS  Allergies  Allergen Reactions   Pseudoephedrine-Ibuprofen Other (See Comments)    urinary retention/incomplete emptying    Patient Measurements: Height: 5' 10 (177.8 cm) Weight: 74.8 kg (164 lb 14.5 oz) IBW/kg (Calculated) : 73 HEPARIN  DW (KG): 74.8  Vital Signs: Temp: 98.2 F (36.8 C) (09/10 0930) Temp Source: Oral (09/10 0930) BP: 139/85 (09/10 0930) Pulse Rate: 69 (09/10 0930)  Labs: Recent Labs    03/22/24 1134 03/23/24 0408  HGB 13.5 11.9*  HCT 42.6 37.6*  PLT 164 150  CREATININE 1.46* 1.14    Estimated Creatinine Clearance: 59.6 mL/min (by C-G formula based on SCr of 1.14 mg/dL).   Medical History: Past Medical History:  Diagnosis Date   Borderline glaucoma    BPH (benign prostatic hypertrophy)    Cancer (HCC)    basal cell carcinoma   GERD (gastroesophageal reflux disease)    History of acute renal failure    adx 10-05-2014  due to bilateral hydronephrosis   History of basal cell carcinoma excision    X2 FACE   History of colon polyps    2001   History of esophageal dilatation    severel times for tortuous esophagus   History of kidney stones    history of urinary retenion    Left nephrolithiasis    Migraine    probably 3 times/yr (10/04/2014)   Renal cyst, left    Wears glasses     Medications:  Medications Prior to Admission  Medication Sig Dispense Refill Last Dose/Taking   aspirin  EC 81 MG tablet Take 81 mg by mouth at bedtime. Swallow whole.   03/21/2024 at  9:00 PM   Cholecalciferol (VITAMIN D3 PO) Take 1 capsule by mouth daily.   03/21/2024 Morning   Coenzyme Q10 (COQ-10) 200 MG CAPS Take 200 mg by mouth daily.   03/21/2024 Morning   HYDROcodone-acetaminophen  (NORCO/VICODIN) 5-325 MG tablet Take 1 tablet by mouth every 6 (six) hours as needed (FOR HEADACHES).   Unknown   hydrocortisone  (ANUSOL -HC) 25 MG suppository Place 25 mg rectally  daily as needed for hemorrhoids.   Unknown   ketorolac  (TORADOL ) 10 MG tablet Take 10 mg by mouth every 6 (six) hours as needed (for headaches).   Unknown   latanoprost  (XALATAN ) 0.005 % ophthalmic solution Place 1 drop into both eyes at bedtime.   03/21/2024 Bedtime   rosuvastatin  (CRESTOR ) 5 MG tablet Take 5 mg by mouth at bedtime.   03/21/2024 Bedtime   sildenafil (VIAGRA) 100 MG tablet Take 50 mg by mouth daily as needed (as directed).   Unknown   timolol  (TIMOPTIC ) 0.5 % ophthalmic solution Place 1 drop into both eyes in the morning and at bedtime.   03/22/2024 Morning   fluorouracil  (EFUDEX ) 5 % cream Apply topically 2 (two) times daily. (Patient not taking: Reported on 03/22/2024) 40 g 2 Not Taking   terbinafine  (LAMISIL ) 250 MG tablet Take 1 tablet (250 mg total) by mouth daily. (Patient not taking: Reported on 03/22/2024) 90 tablet 0 Not Taking    Assessment: Pharmacy is consulted to start heparin  drip on 73 yo male diagnosed with ACS. Not on any blood thinners PTA.   Today, 03/23/24 Hgb 11.9, plt 450     Goal of Therapy:  Heparin  level 0.3-0.7 units/ml Monitor platelets by anticoagulation protocol: Yes   Plan:  Heparin  4000 units, then heparin  1000 units/hr  Obtain HL 8 hours after start of infusion Daily  CBC while on heparin   Monitor for signs and symptoms of bleeding   Dolphus Roller, PharmD, BCPS 03/23/2024 11:32 AM

## 2024-03-23 NOTE — Interval H&P Note (Signed)
 History and Physical Interval Note:  03/23/2024 5:20 PM  Andrew Coffey  has presented today for surgery, with the diagnosis of nstemi.  The various methods of treatment have been discussed with the patient and family. After consideration of risks, benefits and other options for treatment, the patient has consented to  Procedure(s): LEFT HEART CATH AND CORONARY ANGIOGRAPHY (N/A) and possible coronary intervention for unstable angina as a surgical intervention.  The patient's history has been reviewed, patient examined, no change in status, stable for surgery.  I have reviewed the patient's chart and labs.  Questions were answered to the patient's satisfaction.     Gordy Bergamo

## 2024-03-30 NOTE — Progress Notes (Signed)
 Cardiology Office Note   Date:  04/12/2024  ID:  Andrew Coffey, DOB 07-Mar-1951, MRN [id number] PCP: [name] LABOR, MD  [facility] Providers Cardiologist:  Wilbert Bihari, MD   History of Present Illness Andrew Coffey is a 73 y.o. male with a past medical history of GERD, BPH, torturous esophagus s/p dilatation, kidney stones with history of bilateral hydronephrosis, coronary calcifications, dilated ascending aortic aneurysm. Patient is followed by Dr. Bihari and presents today for a hospital follow up appointment   Patient previously had coronary calcium  scoring in 01/2024 that showed a coronary calcium  score of 520 (70th percentile), ascending aortic aneurysm measuring 45 mm.   Recently admitted 9/9-9/10 after he presented with chest pain. Found to have NSTEMI with hsTn peaking at 102. Echocardiogram 9/10 showed EF 60-65%, no regional wall motion abnormalities, grade I DD, normal RV systolic function, trivial MR, mild dilatation of the ascending aorta measuring 44 mm. Underwent cath 9/10 that showed widely patent coronary vessels without any ulcerated plaque or unstable lesions. Possibly related to small vessel disease or transient coronary spasm. Recommended medication management   Today, patient reports that he has been doing very well since his recent admission.  Denies recurrence of chest pain.  Denies shortness of breath, dizziness, syncope, near syncope.  Denies palpitations.  We reviewed his cardiac workup during his recent admission.  Patient was transition from Crestor  5 mg daily to 10 mg daily during that admission and he wondered if this was necessary.  We discussed his elevated coronary calcium  score patient was transition from Crestor  5 mg daily to 10 mg daily during that admission he wondered if this was necessary.  We discussed his elevated coronary calcium  score as well as recommendation for him to stay on aspirin  and Crestor  10 mg daily.  His radial cath site has  healed well.  He likes to stay active by weightlifting.  Advised against straining for long periods of time given his ascending aortic dilation  Studies Reviewed  Cardiac Studies & Procedures   ______________________________________________________________________________________________ CARDIAC CATHETERIZATION  CARDIAC CATHETERIZATION 03/23/2024  Conclusion Images from the original result were not included. Cardiac Catheterization 03/23/24: Hemodynamic data: LVEDP 12 mmHg.  There is no pressure gradient across the aortic valve.  Angiographic data: LM: It is calcified periarterial, no luminal obstruction. LAD: Mild proximal and mid LAD periarterial calcification noted.  Minimal luminal irregularity in the proximal LAD.  Small D1, large D2. RI: Large-caliber vessel.  Smooth and normal. LCx: Mild periarterial calcification evident.  Gives origin to moderate-sized OM 3. RCA: Very large caliber vessel with minimal luminal irregularity and mild periarterial calcification.  Moderate-sized PDA and large PL branch.    Impression and recommendations: Patient presented with chest pain and minimally elevated serum troponin, 30>> 61>> 102>> 94 suggesting ACS/NSTEMI.  However coronary angiography revealing widely patent coronary vessels without any ulcerated plaque or unstable lesions.  Could be related to small vessel disease or may be transient coronary spasm leading to NSTEMI.  For now continue risk modification, continue aspirin  for 3 to 6 months, recently started on statin, rosuvastatin  increased dose to 10 mg daily with goal LDL <70. Mild aortic root dilatation, no evidence of aortic aneurysm.  Probably normal for his height and weight.  Findings Coronary Findings Diagnostic  Dominance: Right  Left Anterior Descending Prox LAD lesion is 10% stenosed.  Right Coronary Artery Mid RCA lesion is 10% stenosed.  Intervention  No interventions have been documented.      ECHOCARDIOGRAM  ECHOCARDIOGRAM COMPLETE 03/23/2024  Narrative ECHOCARDIOGRAM REPORT    Patient Name:   Andrew Coffey Date of Exam: 03/23/2024 Medical Rec #:  [id number]       Height:       70.0 in Accession #:    [id number]      Weight:       164.9 lb Date of Birth:  Jun 30, 1951       BSA:          1.923 m Patient Age:    73 years        BP:           136/84 mmHg Patient Gender: M               HR:           54 bpm. Exam Location:  Inpatient  Procedure: 2D Echo, Color Doppler and Cardiac Doppler (Both Spectral and Color Flow Doppler were utilized during procedure).  Indications:    Chest Pain R07.9  History:        Patient has no prior history of Echocardiogram examinations.  Sonographer:    Andrew Coffey Referring Phys: [phone] Andrew Coffey  IMPRESSIONS   1. Left ventricular ejection fraction, by estimation, is 60 to 65%. The left ventricle has normal function. The left ventricle has no regional wall motion abnormalities. There is mild left ventricular hypertrophy. Left ventricular diastolic parameters are consistent with Grade I diastolic dysfunction (impaired relaxation). 2. Right ventricular systolic function is normal. The right ventricular size is normal. There is normal pulmonary artery systolic pressure. The estimated right ventricular systolic pressure is 17.3 mmHg. 3. Right atrial size was mildly dilated. 4. The mitral valve is grossly normal. Trivial mitral valve regurgitation. 5. The aortic valve is tricuspid. Aortic valve regurgitation is not visualized. Aortic valve sclerosis is present, with no evidence of aortic valve stenosis. 6. Aortic dilatation noted. There is mild dilatation of the ascending aorta, measuring 44 mm. 7. The inferior vena cava is normal in size with greater than 50% respiratory variability, suggesting right atrial pressure of 3 mmHg.  Comparison(s): No prior Echocardiogram.  FINDINGS Left Ventricle: Left ventricular ejection  fraction, by estimation, is 60 to 65%. The left ventricle has normal function. The left ventricle has no regional wall motion abnormalities. The left ventricular internal cavity size was normal in size. There is mild left ventricular hypertrophy. Left ventricular diastolic parameters are consistent with Grade I diastolic dysfunction (impaired relaxation). Indeterminate filling pressures.  Right Ventricle: The right ventricular size is normal. No increase in right ventricular wall thickness. Right ventricular systolic function is normal. There is normal pulmonary artery systolic pressure. The tricuspid regurgitant velocity is 1.89 m/s, and with an assumed right atrial pressure of 3 mmHg, the estimated right ventricular systolic pressure is 17.3 mmHg.  Left Atrium: Left atrial size was normal in size.  Right Atrium: Right atrial size was mildly dilated.  Pericardium: There is no evidence of pericardial effusion.  Mitral Valve: The mitral valve is grossly normal. Trivial mitral valve regurgitation.  Tricuspid Valve: The tricuspid valve is grossly normal. Tricuspid valve regurgitation is trivial.  Aortic Valve: The aortic valve is tricuspid. Aortic valve regurgitation is not visualized. Aortic valve sclerosis is present, with no evidence of aortic valve stenosis.  Pulmonic Valve: The pulmonic valve was grossly normal. Pulmonic valve regurgitation is trivial.  Aorta: Aortic dilatation noted. There is mild dilatation of the ascending aorta, measuring 44 mm.  Venous: The inferior vena cava is  normal in size with greater than 50% respiratory variability, suggesting right atrial pressure of 3 mmHg.  IAS/Shunts: No atrial level shunt detected by color flow Doppler.   LEFT VENTRICLE PLAX 2D LVIDd:         4.80 cm   Diastology LVIDs:         3.40 cm   LV e' medial:    6.96 cm/s LV PW:         1.10 cm   LV E/e' medial:  9.9 LV IVS:        1.10 cm   LV e' lateral:   7.94 cm/s LVOT diam:     2.30  cm   LV E/e' lateral: 8.7 LV SV:         87 LV SV Index:   45 LVOT Area:     4.15 cm   RIGHT VENTRICLE             IVC RV S prime:     11.70 cm/s  IVC diam: 1.80 cm TAPSE (M-mode): 1.5 cm  LEFT ATRIUM             Index        RIGHT ATRIUM           Index LA diam:        3.90 cm 2.03 cm/m   RA Area:     19.80 cm LA Vol (A2C):   52.3 ml 27.20 ml/m  RA Volume:   52.30 ml  27.20 ml/m LA Vol (A4C):   45.7 ml 23.77 ml/m LA Biplane Vol: 50.2 ml 26.11 ml/m AORTIC VALVE LVOT Vmax:   99.00 cm/s LVOT Vmean:  72.700 cm/s LVOT VTI:    0.210 m  AORTA Ao Root diam: 3.70 cm Ao Asc diam:  4.50 cm  MITRAL VALVE               TRICUSPID VALVE MV Area (PHT): 3.01 cm    TR Peak grad:   14.3 mmHg MV Decel Time: 252 msec    TR Vmax:        189.00 cm/s MV E velocity: 69.00 cm/s MV A velocity: 80.60 cm/s  SHUNTS MV E/A ratio:  0.86        Systemic VTI:  0.21 m Systemic Diam: 2.30 cm  Andrew Maxcy MD Electronically signed by Andrew Maxcy MD Signature Date/Time: 03/23/2024/2:44:35 PM    Final      CT SCANS  CT CARDIAC SCORING (SELF PAY ONLY) 01/20/2024  Addendum 01/22/2024  7:59 PM ADDENDUM REPORT: 01/22/2024 19:56  EXAM: OVER-READ INTERPRETATION  CT CHEST  The following report is an over-read performed by radiologist Dr. Fonda Mom [facility], Andrew Coffey on 01/22/2024. This over-read does not include interpretation of cardiac or coronary anatomy or pathology. The coronary calcium  score interpretation by the cardiologist is attached.  COMPARISON:  None.  FINDINGS: Cardiovascular: See findings discussed in the body of the report. Atheromatous calcifications of coronary arteries and aorta.  Mediastinum/Nodes: No suspicious adenopathy identified. Imaged mediastinal structures are unremarkable.  Lungs/Pleura: Imaged lungs are clear. Calcified granuloma right base. No pleural effusion or pneumothorax.  Upper Abdomen: No acute abnormality.  Musculoskeletal: No  chest wall abnormality. No acute osseous findings.  IMPRESSION: No acute extracardiac incidental findings.   Electronically Signed By: Andrew Field M.D. On: 01/22/2024 19:56  Narrative CLINICAL DATA:  Cardiovascular Disease Risk stratification  EXAM: Coronary Calcium  Score  TECHNIQUE: A gated, non-contrast computed tomography scan of the heart was performed using 3mm  slice thickness. Axial images were analyzed on a dedicated workstation. Calcium  scoring of the coronary arteries was performed using the Agatston method.  FINDINGS: Coronary arteries: Normal origins.  Coronary Calcium  Score:  Left main: 0  Left anterior descending artery: 301  Left circumflex artery: 1  Right coronary artery: 218  Total: 520  Percentile: 70  Pericardium: Normal.  Ascending Aorta: Ascending aortic aneurysm measuring 45mm. Recommend CTA or MRA for further evaluation  Dilated main pulmonary artery measuring 32mm  Non-cardiac: See separate report from Andrew Coffey.  IMPRESSION: Coronary calcium  score of 520. This was 70th percentile for age-, race-, and sex-matched controls.  Ascending aortic aneurysm measuring 45mm. Recommend CTA or MRA chest for further evaluation  Dilated main pulmonary artery measuring 32mm  RECOMMENDATIONS: Coronary artery calcium  (CAC) score is a strong predictor of incident coronary heart disease (CHD) and provides predictive information beyond traditional risk factors. CAC scoring is reasonable to use in the decision to withhold, postpone, or initiate statin therapy in intermediate-risk or selected borderline-risk asymptomatic adults (age 69-75 years and LDL-C >=70 to <190 mg/dL) who do not have diabetes or established atherosclerotic cardiovascular disease (ASCVD).* In intermediate-risk (10-year ASCVD risk >=7.5% to <20%) adults or selected borderline-risk (10-year ASCVD risk >=5% to <7.5%) adults in whom a CAC score is measured  for the purpose of making a treatment decision the following recommendations have been made:  If CAC=0, it is reasonable to withhold statin therapy and reassess in 5 to 10 years, as long as higher risk conditions are absent (diabetes mellitus, family history of premature CHD in first degree relatives (males <55 years; females <65 years), cigarette smoking, or LDL >=190 mg/dL).  If CAC is 1 to 99, it is reasonable to initiate statin therapy for patients >=64 years of age.  If CAC is >=100 or >=75th percentile, it is reasonable to initiate statin therapy at any age.  Cardiology referral should be considered for patients with CAC scores >=400 or >=75th percentile.  *2018 AHA/ACC/AACVPR/AAPA/ABC/ACPM/ADA/AGS/APhA/ASPC/NLA/PCNA Guideline on the Management of Blood Cholesterol: A Report of the American College of Cardiology/American Heart Association Task Force on Clinical Practice Guidelines. J Am Coll Cardiol. 2019;73(24):3168-3209.  Andrew Nanas, MD  Electronically Signed: By: Andrew Coffey M.D. On: 01/22/2024 19:52     ______________________________________________________________________________________________      Risk Assessment/Calculations           Physical Exam VS:  BP 114/60   Pulse 73   Ht 5' 10 (1.778 m)   Wt 175 lb 9.6 oz (79.7 kg)   SpO2 98%   BMI 25.20 kg/m        Wt Readings from Last 3 Encounters:  04/12/24 175 lb 9.6 oz (79.7 kg)  03/22/24 164 lb 14.5 oz (74.8 kg)  10/22/23 165 lb (74.8 kg)    GEN: Well nourished, well developed in no acute distress.  Sitting comfortably on the exam table NECK: No JVD  CARDIAC:  RRR, no murmurs, rubs, gallops.  Radial pulses 2+ bilaterally.  Right radial cath site soft, nontender. RESPIRATORY:  Clear to auscultation without rales, wheezing or rhonchi.  Normal work of breathing on room air ABDOMEN: Soft, non-tender, non-distended EXTREMITIES:  No edema in bilateral lower extremities; No  deformity   ASSESSMENT AND PLAN  NSTEMI  Elevated coronary calcium  score -Previously had coronary calcium  scoring in 01/2024 that showed a coronary calcium  score 520 (70th percentile) - Admitted 9/9-9/10 with chest pain, hsTn peaked at 102  - Echocardiogram with EF 60-65%, no regional wall motion  abnormalities  - Cath showed widely patent coronary vessels. Possible trop elevated due to small vessel disease or transient coronary spasm  - Patient denies chest pain since his admission.  He has slowly been increasing his physical activity and has been tolerating it well - Right radial cath site healed appropriately.  Encourage patient to increase physical activity as tolerated - Continue ASA 81 mg daily  - Continue crestor  10 mg daily - LDL 53 in 03/2024  Thoracic Aortic Aneurysm  - CTA chest on 03/22/24 showed 4.2 cm ascending thoracic aortic aneurysm.  -BP well-controlled.  Discussed avoiding fluoroquinolones, avoiding straining - Will need repeat imaging in 1 year    Dispo: Follow up in 1 year with Dr. Shlomo   Signed, Rollo FABIENE Louder, Andrew Coffey-C

## 2024-04-05 DIAGNOSIS — I2510 Atherosclerotic heart disease of native coronary artery without angina pectoris: Secondary | ICD-10-CM | POA: Diagnosis not present

## 2024-04-05 DIAGNOSIS — R079 Chest pain, unspecified: Secondary | ICD-10-CM | POA: Diagnosis not present

## 2024-04-05 DIAGNOSIS — I2584 Coronary atherosclerosis due to calcified coronary lesion: Secondary | ICD-10-CM | POA: Diagnosis not present

## 2024-04-05 DIAGNOSIS — I7121 Aneurysm of the ascending aorta, without rupture: Secondary | ICD-10-CM | POA: Diagnosis not present

## 2024-04-12 ENCOUNTER — Ambulatory Visit: Attending: Cardiology | Admitting: Cardiology

## 2024-04-12 ENCOUNTER — Encounter: Payer: Self-pay | Admitting: Cardiology

## 2024-04-12 VITALS — BP 114/60 | HR 73 | Ht 70.0 in | Wt 175.6 lb

## 2024-04-12 DIAGNOSIS — I214 Non-ST elevation (NSTEMI) myocardial infarction: Secondary | ICD-10-CM

## 2024-04-12 DIAGNOSIS — R931 Abnormal findings on diagnostic imaging of heart and coronary circulation: Secondary | ICD-10-CM

## 2024-04-12 DIAGNOSIS — I77810 Thoracic aortic ectasia: Secondary | ICD-10-CM | POA: Diagnosis not present

## 2024-04-12 NOTE — Patient Instructions (Signed)
 Medication Instructions:  Your physician recommends that you continue on your current medications as directed. Please refer to the Current Medication list given to you today.  *If you need a refill on your cardiac medications before your next appointment, please call your pharmacy*   Follow-Up: At [facility], you and your health needs are our priority.  As part of our continuing mission to provide you with exceptional heart care, our providers are all part of one team.  This team includes your primary Cardiologist (physician) and Advanced Practice Providers or APPs (Physician Assistants and Nurse Practitioners) who all work together to provide you with the care you need, when you need it.  Your next appointment:   1 year(s)  Provider:   Wilbert Bihari, MD

## 2024-05-08 ENCOUNTER — Other Ambulatory Visit: Payer: Self-pay | Admitting: Cardiology

## 2024-05-12 DIAGNOSIS — H04123 Dry eye syndrome of bilateral lacrimal glands: Secondary | ICD-10-CM | POA: Diagnosis not present

## 2024-05-12 DIAGNOSIS — H401122 Primary open-angle glaucoma, left eye, moderate stage: Secondary | ICD-10-CM | POA: Diagnosis not present

## 2024-05-31 ENCOUNTER — Telehealth (HOSPITAL_BASED_OUTPATIENT_CLINIC_OR_DEPARTMENT_OTHER): Payer: Self-pay | Admitting: *Deleted

## 2024-05-31 DIAGNOSIS — K643 Fourth degree hemorrhoids: Secondary | ICD-10-CM | POA: Diagnosis not present

## 2024-05-31 NOTE — Telephone Encounter (Signed)
   Pre-operative Risk Assessment    Patient Name: Andrew Coffey  DOB: 1951/05/05 MRN: [id number]   Date of last office visit: 04/12/24 Andrew Coffey, [facility] Date of next office visit: NONE   Request for Surgical Clearance    Procedure:  HEMORRHOIDECTOMY WITH ANORECTAL EXAM  Date of Surgery:  Clearance TBD                                Surgeon:  DR. LONNI WHITE Surgeon's Group or Practice Name:  [facility] Phone number:  [phone] Fax number:  [phone] CHERMIRA JONES. CMA   Type of Clearance Requested:   - Medical  - Pharmacy:  Hold Aspirin      Type of Anesthesia:  General    Additional requests/questions:    [name]   05/31/2024, 5:33 PM

## 2024-06-01 NOTE — Telephone Encounter (Signed)
 Andrew Coffey,  You saw this patient on 9/302025. Per protocol we request that you comment on his cardiac risk to proceed with HEMORRHOIDECTOMY WITH ANORECTAL EXAM and holding ASA scheduled for TBD, since it has been less than 2 months since evaluated in the office. Please send your comment to P CV Pre-Op Pool.  Thank you, Lamarr Satterfield DNP, ANP, AACC.

## 2024-06-01 NOTE — Telephone Encounter (Signed)
   Patient Name: Andrew Coffey  DOB: April 29, 1951 MRN: [id number]  Primary Cardiologist: Wilbert Bihari, MD  Chart reviewed as part of pre-operative protocol coverage. Given past medical history and time since last visit, based on ACC/AHA guidelines, Andrew Coffey is at acceptable risk for the planned procedure without further cardiovascular testing.   Per Rollo Louder, PA 06/01/2024 Cath on 9/10 showed widely patent coronary vessels without any ulcerated plaque or unstable lesions. When I saw patient, he was chest pain free and was tolerating physical activity. OK to proceed with surgery without further cardiac testing.    OK to hold aspirin  if bleeding risk felt to be high    Thanks KJ   The patient was advised that if he develops new symptoms prior to surgery to contact our office to arrange for a follow-up visit, and he verbalized understanding.  I will route this recommendation to the requesting party via Epic fax function and remove from pre-op pool.  Please call with questions.  Lamarr Satterfield, NP 06/01/2024, 11:56 AM

## 2024-07-05 DIAGNOSIS — J019 Acute sinusitis, unspecified: Secondary | ICD-10-CM | POA: Diagnosis not present

## 2024-07-11 ENCOUNTER — Encounter (HOSPITAL_BASED_OUTPATIENT_CLINIC_OR_DEPARTMENT_OTHER): Payer: Self-pay | Admitting: Surgery

## 2024-07-11 ENCOUNTER — Other Ambulatory Visit: Payer: Self-pay

## 2024-07-18 ENCOUNTER — Encounter (HOSPITAL_BASED_OUTPATIENT_CLINIC_OR_DEPARTMENT_OTHER): Admission: RE | Disposition: A | Payer: Self-pay | Source: Home / Self Care | Attending: Surgery

## 2024-07-18 ENCOUNTER — Ambulatory Visit (HOSPITAL_BASED_OUTPATIENT_CLINIC_OR_DEPARTMENT_OTHER)

## 2024-07-18 ENCOUNTER — Other Ambulatory Visit: Payer: Self-pay

## 2024-07-18 ENCOUNTER — Encounter (HOSPITAL_BASED_OUTPATIENT_CLINIC_OR_DEPARTMENT_OTHER): Payer: Self-pay | Admitting: Surgery

## 2024-07-18 ENCOUNTER — Ambulatory Visit (HOSPITAL_BASED_OUTPATIENT_CLINIC_OR_DEPARTMENT_OTHER)
Admission: RE | Admit: 2024-07-18 | Discharge: 2024-07-18 | Disposition: A | Discharge status: Home / Self Care | Attending: Surgery | Admitting: Surgery

## 2024-07-18 DIAGNOSIS — I252 Old myocardial infarction: Secondary | ICD-10-CM | POA: Insufficient documentation

## 2024-07-18 DIAGNOSIS — I25110 Atherosclerotic heart disease of native coronary artery with unstable angina pectoris: Secondary | ICD-10-CM | POA: Insufficient documentation

## 2024-07-18 DIAGNOSIS — Z01818 Encounter for other preprocedural examination: Secondary | ICD-10-CM

## 2024-07-18 DIAGNOSIS — Z87442 Personal history of urinary calculi: Secondary | ICD-10-CM | POA: Insufficient documentation

## 2024-07-18 DIAGNOSIS — K644 Residual hemorrhoidal skin tags: Secondary | ICD-10-CM | POA: Diagnosis not present

## 2024-07-18 DIAGNOSIS — K219 Gastro-esophageal reflux disease without esophagitis: Secondary | ICD-10-CM | POA: Insufficient documentation

## 2024-07-18 DIAGNOSIS — Z905 Acquired absence of kidney: Secondary | ICD-10-CM | POA: Diagnosis not present

## 2024-07-18 DIAGNOSIS — K648 Other hemorrhoids: Secondary | ICD-10-CM | POA: Insufficient documentation

## 2024-07-18 HISTORY — PX: RECTAL EXAM UNDER ANESTHESIA: SHX6399

## 2024-07-18 HISTORY — PX: HEMORRHOID SURGERY: SHX153

## 2024-07-18 SURGERY — HEMORRHOIDECTOMY
Anesthesia: General | Site: Rectum

## 2024-07-18 MED ORDER — ONDANSETRON HCL 4 MG/2ML IJ SOLN: 4.0000 mg | Freq: Once | INTRAMUSCULAR | Status: DC | PRN

## 2024-07-18 MED ORDER — OXYCODONE HCL 5 MG/5ML PO SOLN: 5.0000 mg | Freq: Once | ORAL | Status: DC | PRN

## 2024-07-18 MED ORDER — LIDOCAINE 2% (20 MG/ML) 5 ML SYRINGE
INTRAMUSCULAR | Status: AC
  Filled 2024-07-18: qty 5

## 2024-07-18 MED ORDER — LACTATED RINGERS IV SOLN: INTRAVENOUS | Status: DC

## 2024-07-18 MED ORDER — PROPOFOL 10 MG/ML IV BOLUS
INTRAVENOUS | Status: AC
  Filled 2024-07-18: qty 20

## 2024-07-18 MED ORDER — DEXAMETHASONE SODIUM PHOSPHATE 4 MG/ML IJ SOLN
INTRAMUSCULAR | Status: DC | PRN
  Administered 2024-07-18: 8 mg via INTRAVENOUS

## 2024-07-18 MED ORDER — LIDOCAINE HCL (CARDIAC) PF 100 MG/5ML IV SOSY
PREFILLED_SYRINGE | INTRAVENOUS | Status: DC | PRN
  Administered 2024-07-18: 50 mg via INTRAVENOUS

## 2024-07-18 MED ORDER — AMISULPRIDE (ANTIEMETIC) 5 MG/2ML IV SOLN: 10.0000 mg | Freq: Once | INTRAVENOUS | Status: DC | PRN

## 2024-07-18 MED ORDER — FENTANYL CITRATE (PF) 100 MCG/2ML IJ SOLN
INTRAMUSCULAR | Status: DC | PRN
  Administered 2024-07-18: 50 ug via INTRAVENOUS

## 2024-07-18 MED ORDER — MIDAZOLAM HCL 2 MG/2ML IJ SOLN
INTRAMUSCULAR | Status: AC
  Filled 2024-07-18: qty 2

## 2024-07-18 MED ORDER — FENTANYL CITRATE (PF) 100 MCG/2ML IJ SOLN
INTRAMUSCULAR | Status: AC
  Filled 2024-07-18: qty 2

## 2024-07-18 MED ORDER — ONDANSETRON HCL 4 MG/2ML IJ SOLN
INTRAMUSCULAR | Status: DC | PRN
  Administered 2024-07-18: 4 mg via INTRAVENOUS

## 2024-07-18 MED ORDER — ROCURONIUM BROMIDE 10 MG/ML (PF) SYRINGE
PREFILLED_SYRINGE | INTRAVENOUS | Status: AC
  Filled 2024-07-18: qty 10

## 2024-07-18 MED ORDER — PROPOFOL 10 MG/ML IV BOLUS
INTRAVENOUS | Status: DC | PRN
  Administered 2024-07-18: 180 mg via INTRAVENOUS

## 2024-07-18 MED ORDER — BUPIVACAINE LIPOSOME 1.3 % IJ SUSP: 20.0000 mL | Freq: Once | INTRAMUSCULAR | Status: DC

## 2024-07-18 MED ORDER — TRAMADOL HCL 50 MG PO TABS: 50.0000 mg | ORAL_TABLET | Freq: Four times a day (QID) | ORAL | 0 refills | Status: AC | PRN

## 2024-07-18 MED ORDER — OXYCODONE HCL 5 MG PO TABS: 5.0000 mg | ORAL_TABLET | Freq: Once | ORAL | Status: DC | PRN

## 2024-07-18 MED ORDER — SUGAMMADEX SODIUM 200 MG/2ML IV SOLN
INTRAVENOUS | Status: DC | PRN
  Administered 2024-07-18: 200 mg via INTRAVENOUS

## 2024-07-18 MED ORDER — BUPIVACAINE-EPINEPHRINE (PF) 0.25% -1:200000 IJ SOLN
INTRAMUSCULAR | Status: DC | PRN
  Administered 2024-07-18: 50 mL

## 2024-07-18 MED ORDER — ROCURONIUM BROMIDE 100 MG/10ML IV SOLN
INTRAVENOUS | Status: DC | PRN
  Administered 2024-07-18: 60 mg via INTRAVENOUS

## 2024-07-18 MED ORDER — FENTANYL CITRATE (PF) 100 MCG/2ML IJ SOLN: 25.0000 ug | INTRAMUSCULAR | Status: DC | PRN

## 2024-07-18 MED ORDER — ONDANSETRON HCL 4 MG/2ML IJ SOLN
INTRAMUSCULAR | Status: AC
  Filled 2024-07-18: qty 2

## 2024-07-18 MED ORDER — ACETAMINOPHEN 10 MG/ML IV SOLN: 1000.0000 mg | Freq: Once | INTRAVENOUS | Status: DC | PRN

## 2024-07-18 MED ORDER — MIDAZOLAM HCL 5 MG/5ML IJ SOLN
INTRAMUSCULAR | Status: DC | PRN
  Administered 2024-07-18 (×2): 1 mg via INTRAVENOUS

## 2024-07-18 MED ORDER — PHENYLEPHRINE HCL (PRESSORS) 10 MG/ML IV SOLN
INTRAVENOUS | Status: DC | PRN
  Administered 2024-07-18 (×2): 80 ug via INTRAVENOUS
  Administered 2024-07-18: 40 ug via INTRAVENOUS
  Administered 2024-07-18 (×5): 80 ug via INTRAVENOUS

## 2024-07-18 SURGICAL SUPPLY — 34 items
BLADE EXTENDED COATED 6.5IN (ELECTRODE) ×1 IMPLANT
BLADE SURG 15 STRL LF DISP TIS (BLADE) IMPLANT
BRIEF MESH DISP 2XL (UNDERPADS AND DIAPERS) ×1 IMPLANT
COVER BACK TABLE 60X90IN (DRAPES) ×1 IMPLANT
COVER MAYO STAND STRL (DRAPES) ×1 IMPLANT
DRAPE LAPAROTOMY 100X72 PEDS (DRAPES) ×1 IMPLANT
DRAPE UTILITY XL STRL (DRAPES) ×1 IMPLANT
ELECTRODE REM PT RTRN 9FT ADLT (ELECTROSURGICAL) ×1 IMPLANT
GAUZE 4X4 16PLY ~~LOC~~+RFID DBL (SPONGE) ×1 IMPLANT
GAUZE PAD ABD 8X10 STRL (GAUZE/BANDAGES/DRESSINGS) IMPLANT
GAUZE SPONGE 4X4 12PLY STRL (GAUZE/BANDAGES/DRESSINGS) ×1 IMPLANT
GLOVE BIO SURGEON STRL SZ7.5 (GLOVE) ×1 IMPLANT
GLOVE INDICATOR 8.0 STRL GRN (GLOVE) ×1 IMPLANT
GOWN STRL REUS W/TWL XL LVL3 (GOWN DISPOSABLE) ×1 IMPLANT
KIT SIGMOIDOSCOPE (SET/KITS/TRAYS/PACK) IMPLANT
LIGASURE 7.4 SM JAW OPEN (ELECTROSURGICAL) IMPLANT
LOOP VASCLR MAXI BLUE 18IN ST (MISCELLANEOUS) IMPLANT
NEEDLE HYPO 22X1.5 SAFETY MO (MISCELLANEOUS) ×1 IMPLANT
NS IRRIG 500ML POUR BTL (IV SOLUTION) ×1 IMPLANT
PACK BASIN DAY SURGERY FS (CUSTOM PROCEDURE TRAY) ×1 IMPLANT
PENCIL SMOKE EVACUATOR (MISCELLANEOUS) ×1 IMPLANT
SLEEVE SCD COMPRESS KNEE MED (STOCKING) ×1 IMPLANT
SOLN STERILE WATER 500 ML (IV SOLUTION) IMPLANT
SPONGE SURGIFOAM ABS GEL 100 (HEMOSTASIS) IMPLANT
SPONGE SURGIFOAM ABS GEL 12-7 (HEMOSTASIS) IMPLANT
SURGILUBE 2OZ TUBE FLIPTOP (MISCELLANEOUS) IMPLANT
SUT VIC AB 2-0 UR6 27 (SUTURE) ×1 IMPLANT
SUT VIC AB 3-0 SH 27X BRD (SUTURE) ×1 IMPLANT
SYR BULB EAR ULCER 3OZ GRN STR (SYRINGE) IMPLANT
SYR CONTROL 10ML LL (SYRINGE) ×1 IMPLANT
TOWEL GREEN STERILE FF (TOWEL DISPOSABLE) ×1 IMPLANT
TRAY DSU PREP LF (CUSTOM PROCEDURE TRAY) ×1 IMPLANT
TUBE CONNECTING 20X1/4 (TUBING) ×1 IMPLANT
YANKAUER SUCT BULB TIP NO VENT (SUCTIONS) ×1 IMPLANT

## 2024-07-18 NOTE — Anesthesia Preprocedure Evaluation (Addendum)
 "                                  Anesthesia Evaluation  Patient identified by MRN, date of birth, ID band Patient awake    Reviewed: Allergy & Precautions, NPO status , Patient's Chart, lab work & pertinent test results  History of Anesthesia Complications Negative for: history of anesthetic complications  Airway Mallampati: I  TM Distance: >3 FB Neck ROM: Full    Dental  (+) Teeth Intact, Dental Advisory Given   Pulmonary    breath sounds clear to auscultation       Cardiovascular + Past MI (NSTEMI (03/2024) 2/2 Vasospasm?; LHC unremarkable)   Rhythm:Regular Rate:Normal  ECHOCARDIOGRAM COMPLETE 03/23/2024  1. Left ventricular ejection fraction, by estimation, is 60 to 65%. The left ventricle has normal function. The left ventricle has no regional wall motion abnormalities. There is mild left ventricular hypertrophy. Left ventricular diastolic parameters are consistent with Grade I diastolic dysfunction (impaired relaxation). 2. Right ventricular systolic function is normal. The right ventricular size is normal. There is normal pulmonary artery systolic pressure. The estimated right ventricular systolic pressure is 17.3 mmHg. 3. Right atrial size was mildly dilated. 4. The mitral valve is grossly normal. Trivial mitral valve regurgitation. 5. The aortic valve is tricuspid. Aortic valve regurgitation is not visualized. Aortic valve sclerosis is present, with no evidence of aortic valve stenosis. 6. Aortic dilatation noted. There is mild dilatation of the ascending aorta, measuring 44 mm. 7. The inferior vena cava is normal in size with greater than 50% respiratory variability, suggesting right atrial pressure of 3 mmHg.   Cardiac Catheterization 03/23/24: Hemodynamic data: LVEDP 12 mmHg.  There is no pressure gradient across the aortic valve.   Angiographic data: LM: It is calcified periarterial, no luminal obstruction. LAD: Mild proximal and mid LAD periarterial  calcification noted.  Minimal luminal irregularity in the proximal LAD.  Small D1, large D2. RI: Large-caliber vessel.  Smooth and normal. LCx: Mild periarterial calcification evident.  Gives origin to moderate-sized OM 3. RCA: Very large caliber vessel with minimal luminal irregularity and mild periarterial calcification.  Moderate-sized PDA and large PL branch.    Neuro/Psych  Headaches    GI/Hepatic ,GERD  Medicated and Controlled,, Torturous esophagus s/p dilatation   Endo/Other    Renal/GU Renal disease S/p Partial Nephrectomy  Hx of Kidney Stones  Hx of Urinary Retention       Musculoskeletal   Abdominal   Peds  Hematology  (+) Blood dyscrasia, anemia   Anesthesia Other Findings   Reproductive/Obstetrics                              Anesthesia Physical Anesthesia Plan  ASA: 2  Anesthesia Plan: General   Post-op Pain Management:    Induction: Intravenous  PONV Risk Score and Plan: 2 and Ondansetron , Dexamethasone  and Treatment may vary due to age or medical condition  Airway Management Planned: Oral ETT  Additional Equipment: None  Intra-op Plan:   Post-operative Plan: Extubation in OR  Informed Consent: I have reviewed the patients History and Physical, chart, labs and discussed the procedure including the risks, benefits and alternatives for the proposed anesthesia with the patient or authorized representative who has indicated his/her understanding and acceptance.     Dental advisory given  Plan Discussed with: CRNA  Anesthesia Plan Comments: (Recent sinusitis (  greater than 7 days ago) without respiratory symptoms. CTAB - discussed increased respiratory complications. Patient understanding and OK to proceed. )         Anesthesia Quick Evaluation  "

## 2024-07-18 NOTE — Anesthesia Postprocedure Evaluation (Signed)
"   Anesthesia Post Note  Patient: Andrew Coffey  Procedure(s) Performed: HEMORRHOIDECTOMY (Rectum) EXAM UNDER ANESTHESIA, RECTUM (Rectum)     Patient location during evaluation: Phase II Anesthesia Type: General Level of consciousness: awake and alert Pain management: pain level controlled Vital Signs Assessment: post-procedure vital signs reviewed and stable Respiratory status: spontaneous breathing and nonlabored ventilation Cardiovascular status: stable Postop Assessment: no apparent nausea or vomiting and adequate PO intake Anesthetic complications: no   No notable events documented.  Last Vitals:  Vitals:   07/18/24 0909 07/18/24 0915  BP:  136/82  Pulse: 62 64  Resp: 13 (!) 8  Temp:    SpO2: 98% 96%    Last Pain:  Vitals:   07/18/24 0945  TempSrc:   PainSc: 0-No pain                 [name]      "

## 2024-07-18 NOTE — Op Note (Signed)
 07/18/2024  8:31 AM  PATIENT:  Andrew Coffey  74 y.o. male  Patient Care Team: [name] LABOR, MD as PCP - General (Internal Medicine) Shlomo Wilbert SAUNDERS, MD as PCP - Cardiology (Cardiology)  PRE-OPERATIVE DIAGNOSIS:  External + internal hemorrhoids  POST-OPERATIVE DIAGNOSIS:   Left lateral mixed internal and external hemorrhoids Right anterior mixed internal and external hemorrhoids  PROCEDURE:   Left lateral hemorrhoidectomy, right anterior hemorrhoidectomy Anorectal exam under anesthesia  SURGEON:  Surgeon(s): Teresa Lonni HERO, MD  ASSISTANT: OR Staff   ANESTHESIA:   local and general  SPECIMEN:   Left lateral hemorrhoidal tissue Right anterior hemorrhoidal tissue  DISPOSITION OF SPECIMEN:  PATHOLOGY  COUNTS:  Sponge, needle, and instrument counts were reported correct x2 at conclusion.  EBL: 5 mL  Drains: none  PLAN OF CARE: Discharge to home after PACU  PATIENT DISPOSITION:  PACU - hemodynamically stable.  OR FINDINGS: Mixed internal and external hemorrhoids in both the left lateral and right anterior position.  2 column hemorrhoidectomy carried out uneventfully.  No other notable pathology within the anal canal or distal rectum on circumferential anoscopy.  DESCRIPTION: The patient was seen in the pre-op holding area. The risks, benefits, complications, treatment options, and expected outcomes were previously discussed with the patient. The patient agreed with the proposed plan and has signed the informed consent form. The patient was brought to the operating room by the surgical team, identified as Andrew Coffey, and the procedure verified. SCD's were applied. General anesthesia was induced without difficulty. The patient was then rolled onto the OR table in the prone jackknife position.  Pressure points were evaluated and padded.  Benzoin was applied to the buttocks and they were gently taped apart.  He was then prepped and draped in usual sterile  fashion. A time out was completed and the above information confirmed and need for preoperative antibiotics.  A perianal block was then created using a dilute mixture of 0.25% Marcaine  with epinephrine  and Exparel .  After ascertaining an appropriate level of anesthesia had been achieved, a well lubricated digital rectal exam was performed. This demonstrated no palpable masses or other significant abnormalities.  Mildly patulous anal canal. A Hill-Ferguson anoscope was into the anal canal and circumferential inspection demonstrated healthy appearing anoderm.  There are at least 2 column mixed internal and external hemorrhoids in both the left lateral and right anterior position.  Largest of these is in the left lateral position.  Attention is directed at this first.  With the Hill-Ferguson anoscope in place, the left lateral mixed internal and external hemorrhoidal tissue was elevated with a DeBakey forcep.  These are confluent with each another.  The anoderm was incised sharply.  The external component is carefully dissected free of any underlying sphincter muscle.  The internal component is dissected similarly from the internal sphincter.  No sphincter muscle is divided.  The base of the hemorrhoid tissue was ligated and divided using the hand-held LigaSure device.  Specimen is passed off as left lateral hemorrhoidal tissue.  Small hemorrhoidal varicosities are then fulgurated with electrocautery.  Hemostasis is observed.  A 2-0 Vicryl figure-of-eight sutures placed at the apex of the hemorrhoidectomy.  A running 2-0 Vicryl suture was then used to close the Soltan hemorrhoidal defect internally.  We then transition to a 3-0 Vicryl suture distal to the anal verge.  The area was irrigated.  Hemostasis is verified.  Attention is then directed at the right anterior mixed internal and external hemorrhoidal tissue.  This  was elevated similarly with a DeBakey forcep.  The internal and external components are  confluent with each other. The anoderm was incised sharply.  The external component is carefully dissected free of any underlying sphincter muscle.  The internal component is dissected similarly from the internal sphincter.  No sphincter muscle is divided.  The base of the hemorrhoid tissue was ligated and divided using the hand-held LigaSure device.  Specimen is passed off as right anterior hemorrhoidal tissue.  Small hemorrhoidal varicosities are then fulgurated similarly with electrocautery.  Hemostasis is observed.  A 2-0 Vicryl figure-of-eight sutures placed at the apex of the hemorrhoidectomy.  A running 2-0 Vicryl suture was then used to close resultant hemorrhoidal defect internally.  We then transition to a 3-0 Vicryl suture distal to the anal verge.  The area was irrigated.  Hemostasis is verified.  Additional local anesthetic is then infiltrated into the hemorrhoidectomy sites.  All sponge, needle, and instrument counts are reported correct.  Final inspection of the anal canal demonstrates hemostasis.  The buttocks were subsequently untaped and a dressing consisting of 4 x 4's, ABD, and mesh underwear is ultimately placed.  He is then rolled back onto a stretcher, awakened from anesthesia, extubated, and transported to the recovery room in satisfactory condition.  DISPOSITION: PACU in satisfactory condition.

## 2024-07-18 NOTE — Anesthesia Procedure Notes (Signed)
 Procedure Name: Intubation Date/Time: 07/18/2024 7:42 AM  Performed by: Buster Catheryn SAUNDERS, CRNAPre-anesthesia Checklist: Patient identified, Emergency Drugs available, Suction available and Patient being monitored Patient Re-evaluated:Patient Re-evaluated prior to induction Oxygen Delivery Method: Circle system utilized Preoxygenation: Pre-oxygenation with 100% oxygen Induction Type: IV induction Ventilation: Mask ventilation without difficulty Laryngoscope Size: Miller and 2 Grade View: Grade II Tube type: Oral Tube size: 7.0 mm Number of attempts: 1 Airway Equipment and Method: Stylet Placement Confirmation: ETT inserted through vocal cords under direct vision, positive ETCO2 and breath sounds checked- equal and bilateral Secured at: 22 cm Tube secured with: Tape Dental Injury: Teeth and Oropharynx as per pre-operative assessment

## 2024-07-18 NOTE — Discharge Instructions (Addendum)
 ANORECTAL SURGERY: POST OP INSTRUCTIONS  DIET: Follow a light bland diet the first 24 hours after arrival home, such as soup, liquids, crackers, etc.  Be sure to include lots of fluids daily.  Avoid fast food or heavy meals as your are more likely to get nauseated.  Eat a low fat diet the next few days after surgery.   Some bleeding with bowel movements is expected for the first couple of days but this should stop in between bowel movements  Take your usually prescribed home medications unless otherwise directed. No foreign bodies per rectum for the next 3 months (enemas, etc)  PAIN CONTROL: It is helpful to take an over-the-counter pain medication regularly for the first few days/weeks.  Choose from the following that works best for you: Ibuprofen (Advil, etc) Three 200mg  tabs every 6 hours as needed. Acetaminophen  (Tylenol , etc) 500-650mg  every 6 hours as needed NOTE: You may take both of these medications together - most patients find it most helpful when alternating between the two (i.e. Ibuprofen at 6am, tylenol  at 9am, ibuprofen at 12pm ..SABRA) A  prescription for pain medication may have been prescribed for you at discharge.  Take your pain medication as prescribed.  If you are having problems/concerns with the prescription medicine, please call us  for further advice.  Avoid getting constipated.  Between the surgery and the pain medications, it is common to experience some constipation.  Increasing fluid intake (64oz of water  per day) and taking a fiber supplement (such as Metamucil, Citrucel, FiberCon) 1-2 times a day regularly will usually help prevent this problem from occurring.  Take Miralax (over the counter) 1-2x/day while taking a narcotic pain medication. If no bowel movement after 48hours, you may additionally take a laxative like a bottle of Milk of Magnesia which can be purchased over the counter. Avoid enemas.   Watch out for diarrhea.  If you have many loose bowel movements,  simplify your diet to bland foods.  Stop any stool softeners and decrease your fiber supplement. If this worsens or does not improve, please call us .  Wash / shower every day.  If you were discharged with a dressing, you may remove this the day after your surgery. You may shower normally, getting soap/water  on your wound, particularly after bowel movements.  Soaking in a warm bath filled a couple inches (Sitz bath) is a great way to clean the area after a bowel movement and many patients find it is a way to soothe the area.  ACTIVITIES as tolerated:   You may resume regular (light) daily activities beginning the next day--such as daily self-care, walking, climbing stairs--gradually increasing activities as tolerated.  If you can walk 30 minutes without difficulty, it is safe to try more intense activity such as jogging, treadmill, bicycling, low-impact aerobics, etc. Refrain from any heavy lifting or straining for the first 2 weeks after your procedure, particularly if your surgery was for hemorrhoids. Avoid activities that make your pain worse You may drive when you are no longer taking prescription pain medication, you can comfortably wear a seatbelt, and you can safely maneuver your car and apply brakes.  FOLLOW UP in our office Please call [facility] at [phone] to set up an appointment to see your surgeon in the office for a follow-up appointment approximately 2 weeks after your surgery. Make sure that you call for this appointment the day you arrive home to insure a convenient appointment time.  9. If you have disability or family leave forms  that need to be completed, you may have them completed by your primary care physician's office; for return to work instructions, please ask our office staff and they will be happy to assist you in obtaining this documentation   When to call us  [phone]: Poor pain control Reactions / problems with new medications (rash/itching, etc)  Fever over  101.5 F (38.5 C) Inability to urinate Nausea/vomiting Worsening swelling or bruising Continued bleeding from incision. Increased pain, redness, or drainage from the incision  The clinic staff is available to answer your questions during regular business hours (8:30am-5pm).  Please dont hesitate to call and ask to speak to one of our nurses for clinical concerns.   A surgeon from [facility] is always on call at the hospitals   If you have a medical emergency, go to the nearest emergency room or call 911.   [facility] [address], [place], [place]  [address] MAIN: [phone] FAX: [phone] [email or web address]   Post Anesthesia Home Care Instructions  Activity: Get plenty of rest for the remainder of the day. A responsible individual must stay with you for 24 hours following the procedure.  For the next 24 hours, DO NOT: -Drive a car -Advertising copywriter -Drink alcoholic beverages -Take any medication unless instructed by your physician -Make any legal decisions or sign important papers.  Meals: Start with liquid foods such as gelatin or soup. Progress to regular foods as tolerated. Avoid greasy, spicy, heavy foods. If nausea and/or vomiting occur, drink only clear liquids until the nausea and/or vomiting subsides. Call your physician if vomiting continues.  Special Instructions/Symptoms: Your throat may feel dry or sore from the anesthesia or the breathing tube placed in your throat during surgery. If this causes discomfort, gargle with warm salt water . The discomfort should disappear within 24 hours.  If you had a scopolamine patch placed behind your ear for the management of post- operative nausea and/or vomiting:  1. The medication in the patch is effective for 72 hours, after which it should be removed.  Wrap patch in a tissue and discard in the trash. Wash hands thoroughly with soap and  water . 2. You may remove the patch earlier than 72 hours if you experience unpleasant side effects which may include dry mouth, dizziness or visual disturbances. 3. Avoid touching the patch. Wash your hands with soap and water  after contact with the patch.    Information for Discharge Teaching: EXPAREL  (bupivacaine  liposome injectable suspension)   Pain relief is important to your recovery. The goal is to control your pain so you can move easier and return to your normal activities as soon as possible after your procedure. Your physician may use several types of medicines to manage pain, swelling, and more.  Your surgeon or anesthesiologist gave you EXPAREL (bupivacaine ) to help control your pain after surgery.  EXPAREL  is a local anesthetic designed to release slowly over an extended period of time to provide pain relief by numbing the tissue around the surgical site. EXPAREL  is designed to release pain medication over time and can control pain for up to 72 hours. Depending on how you respond to EXPAREL , you may require less pain medication during your recovery. EXPAREL  can help reduce or eliminate the need for opioids during the first few days after surgery when pain relief is needed the most. EXPAREL  is not an opioid and is not addictive. It does not cause sleepiness or sedation.   Important!  A teal colored band has been placed on your arm with the date, time and amount of EXPAREL  you have received. Please leave this armband in place for the full 96 hours following administration, and then you may remove the band. If you return to the hospital for any reason within 96 hours following the administration of EXPAREL , the armband provides important information that your health care providers to know, and alerts them that you have received this anesthetic.    Possible side effects of EXPAREL : Temporary loss of sensation or ability to move in the area where medication was injected. Nausea, vomiting,  constipation Rarely, numbness and tingling in your mouth or lips, lightheadedness, or anxiety may occur. Call your doctor right away if you think you may be experiencing any of these sensations, or if you have other questions regarding possible side effects.  Follow all other discharge instructions given to you by your surgeon or nurse. Eat a healthy diet and drink plenty of water  or other fluids.

## 2024-07-18 NOTE — Transfer of Care (Signed)
 Immediate Anesthesia Transfer of Care Note  Patient: Andrew Coffey  Procedure(s) Performed: HEMORRHOIDECTOMY (Rectum) EXAM UNDER ANESTHESIA, RECTUM (Rectum)  Patient Location: PACU  Anesthesia Type:General  Level of Consciousness: awake, alert , and oriented  Airway & Oxygen Therapy: Patient Spontanous Breathing and Patient connected to face mask oxygen  Post-op Assessment: Report given to RN and Post -op Vital signs reviewed and stable  Post vital signs: Reviewed and stable  Last Vitals:  Vitals Value Taken Time  BP 151/99 07/18/24 08:45  Temp    Pulse 64 07/18/24 08:49  Resp 14 07/18/24 08:49  SpO2 100 % 07/18/24 08:49  Vitals shown include unfiled device data.  Last Pain:  Vitals:   07/18/24 0641  TempSrc: Temporal  PainSc: 0-No pain         Complications: No notable events documented.

## 2024-07-18 NOTE — H&P (Addendum)
 "  CC: Here today for surgery  HPI: Andrew Coffey is an 74 y.o. male with history of CAD (recent unstable angina/NSTEMI?), HLD, GERD, kidney stones, whom is seen in the office today as a referral by Dr. Charlott for evaluation of hemorrhoids.  He notes a several year history of issues with bright red blood per rectum and some degree of hemorrhoidal prolapse externally. He will occasionally reduce this. He denies any pain with bowel movements. He is currently taking fiber Gummies-potentially Metamucil. He reports a daily bowel movement spending a minute on the commode with variable consistency stool. He denies any straining. He drinks 6 to 7 glasses of water  per day. He denies any prior anorectal surgeries or procedures.  He follows with [facility] and reports he had a colonoscopy within the last year with Dr. Rosalie or one of his associates  He has previously seen one of our partners, Dr. Sheldon, who did not feel he was a good candidate for hemorrhoidal banding given external components as well and therefore would offered hemorrhoidectomy/hemorrhoidopexy. He opted not to proceed with that.  Colonoscopy - Dr. Elicia 08/05/23: -Hemorrhoids on perianal exam - 6 mm polyp in cecum removed - 5 mm polyp in descending colon removed - Diverticulosis - Internal hemorrhoids PATH -cecum-sessile serrated adenoma/polyp; descending colon polyp-fecal material, no colonic mucosa present  He denies any changes in health or health history since we met in the office. No new medications/allergies. He states he is ready for surgery today.   PMH: CAD (recent unstable angina/NSTEMI?), HLD, GERD, kidney stones,  PSH: TURP 2016; laparoscopic inguinal hernia repair-Dr. Rubin; laparoscopic left renal cyst marsupialization-Dr. Cam  FHx: Denies any known family history of colorectal, breast, endometrial or ovarian cancer  Social Hx: Denies use of tobacco/EtOH/illicit drug. He is here today by himself. 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    Past Medical History:  Diagnosis Date   Borderline glaucoma    BPH (benign prostatic hypertrophy)    Cancer (HCC)    basal cell carcinoma   GERD (gastroesophageal reflux disease)    History of acute renal failure    adx 10-05-2014  due to bilateral hydronephrosis   History of basal cell carcinoma excision    X2 FACE   History of colon polyps    2001   History of esophageal dilatation    severel times for tortuous esophagus   History of kidney stones    history of urinary retenion    Left nephrolithiasis    Migraine    probably 3 times/yr (10/04/2014)   Renal cyst, left    Wears glasses     Past Surgical History:  Procedure Laterality Date   CATARACT EXTRACTION W/ INTRAOCULAR LENS IMPLANT Right 07/14/2010   COLONOSCOPY W/ POLYPECTOMY  07/15/1999   CYSTOSCOPY/URETEROSCOPY/HOLMIUM LASER/STENT PLACEMENT Left 03/20/2022   Procedure: CYSTOSCOPY/LEFT URETEROSCOPY/HOLMIUM LASER/STONE EXTRACTION/ LEFT URETERAL STENT PLACEMENT;  Surgeon: Cam Morene ORN, MD;  Location: [facility];  Service: Urology;  Laterality: Left;  100 MINUTES NEEDED   ESOPHAGOGASTRODUODENOSCOPY  10/22/2011   Procedure: ESOPHAGOGASTRODUODENOSCOPY (EGD);  Surgeon: Elsie Cree, MD;  Location: [facility];  Service: Endoscopy;  Laterality: N/A;   ESOPHAGOGASTRODUODENOSCOPY  11/21/2011   Procedure: ESOPHAGOGASTRODUODENOSCOPY (EGD);  Surgeon: Oliva FORBES Rosalie, MD;  Location: [facility]COPY;  Service: Endoscopy;  Laterality: N/A;  have balloons available.    EXTRACORPOREAL SHOCK WAVE LITHOTRIPSY  07/14/2000   INGUINAL HERNIA REPAIR Right 01/24/2021   Procedure: LAPAROSCOPIC RIGHT INGUINAL AND UMBILICAL  HERNIA REPAIR WITH MESH;  Surgeon: Rubin Calamity, MD;  Location: [facility];  Service: General;  Laterality: Right;   LAPAROSCOPIC PARTIAL NEPHRECTOMY Left 01/24/2021   Procedure: LAPAROSCOPIC LEFT RENAL CYST MARSUPIALIZATION;  Surgeon: Cam Morene ORN, MD;  Location: [facility];  Service: Urology;  Laterality: Left;   LEFT HEART CATH AND CORONARY ANGIOGRAPHY N/A 03/23/2024   Procedure: LEFT HEART CATH AND CORONARY ANGIOGRAPHY;  Surgeon: Ladona Heinz, MD;  Location: [facility];  Service: Cardiovascular;  Laterality: N/A;   MOHS SURGERY  07/14/2012   nose   PARS PLANA VITRECTOMY W/ REPAIR OF MACULAR HOLE Right 08/14/2009   SAVORY DILATION  11/21/2011   Procedure: SAVORY DILATION;  Surgeon: Oliva FORBES Boots, MD;  Location: [facility] END[facility]COPY;  Service: Endoscopy;  Laterality: N/A;   TONSILLECTOMY  ~ 1960   TRANSURETHRAL RESECTION OF PROSTATE N/A 11/01/2014   Procedure: TRANSURETHRAL RESECTION OF THE PROSTATE WITH GYRUS INSTRUMENTS;  Surgeon: Alm Fragmin, MD;  Location: [facility];  Service: Urology;  Laterality: N/A;   VENTRAL HERNIA REPAIR N/A 01/24/2021   Procedure: LAPAROSCOPIC VENTRAL HERNIA;  Surgeon: Rubin Calamity, MD;  Location: [facility];  Service: General;  Laterality: N/A;    Family History  Problem Relation Age of Onset   Thrombocytopenia Mother        died of ITP   Heart attack Father    Thyroid  disease Sister     Social:  reports that he has never smoked. He has never used smokeless tobacco. He reports current alcohol  use of about 4.0 standard drinks of alcohol  per week. He reports that he does not use drugs.  Allergies: Allergies[1]  Medications: I have reviewed the patient's current medications.  No results found for this or any previous visit (from the past 48 hours).  No results found.   PE Blood pressure 135/83, pulse (!) 56, temperature 97.8 F (36.6 C), temperature source Temporal, resp. rate 16, height 5' 10 (1.778 m), weight 79.4 kg, SpO2 99%. Constitutional: NAD; conversant Eyes: Moist conjunctiva; no lid lag; anicteric Lungs: Normal respiratory effort CV: RRR Psychiatric: Appropriate affect  No results found for this or any previous visit (from the past 48 hours).  No results  found.  A/P: Andrew Coffey is an 74 y.o. male with hx of CAD (recent unstable angina/NSTEMI?), HLD, GERD, kidney stones here for evaluation of bright red blood per rectum in the setting of mixed internal and external hemorrhoids  - Cardiac clearance from Dr. Jen obtained   - We discussed the anatomy and physiology of the GI tract and pathophysiology of hemorrhoids with associated illustrations using her hemorrhoid handbook, publisher Krames. - I recommended he begin taking a fiber supplement (Metamucil/Citrucel/FiberCon/Benefiber/Konsyl), drinks 6-8, 8 oz glasses of water  per day, and continue to work to minimize time on commode 2 to 3 minutes. - We discussed additional treatment options should the above maneuvers not improve symptoms to the degree desired after 2 to 3 months. -That said, he has optimize himself and is already doing most if not all the above and has had persistent symptoms despite this. Given the external component, I do agree with Dr. Sheldon that any sort of banding would be quite challenging and likely uncomfortable.  - We have reviewed options going forward including further observation (with risk of persistent symptoms) vs surgery - hemorrhoidectomy; anorectal exam under anesthesia - The procedure, material risks (including, but not limited to, pain, bleeding, infection, scarring, need for blood transfusion, damage to anal sphincter, incontinence  of gas and/or stool, need for additional procedures, rare cases of pelvic sepsis, recurrence, blood clot, pulmonary embolus, pneumonia, heart attack, stroke, death) benefits and alternatives to surgery were discussed at length. The patient's questions were welcomed and answered to his satisfaction, he voiced understanding and would like to think about things. Offered follow-up but he would prefer to call us  if he wished to proceed with surgery scheduling. Additionally, we discussed typical postoperative expectations and the  recovery process.   Lonni Pizza, MD [facility]     [1]  Allergies Allergen Reactions   Pseudoephedrine-Ibuprofen Other (See Comments)    urinary retention/incomplete emptying   "

## 2024-07-19 ENCOUNTER — Encounter (HOSPITAL_BASED_OUTPATIENT_CLINIC_OR_DEPARTMENT_OTHER): Payer: Self-pay | Admitting: Surgery

## 2024-07-19 LAB — SURGICAL PATHOLOGY
